# Patient Record
Sex: Male | Born: 1940 | Race: White | Hispanic: No | Marital: Married | Smoking: Former smoker
Health system: Southern US, Community
[De-identification: ages and names within clinical notes are randomized; demographics above are authoritative.]

## PROBLEM LIST (undated history)

## (undated) DIAGNOSIS — I10 Essential (primary) hypertension: Secondary | ICD-10-CM

## (undated) DIAGNOSIS — I639 Cerebral infarction, unspecified: Secondary | ICD-10-CM

## (undated) DIAGNOSIS — E785 Hyperlipidemia, unspecified: Secondary | ICD-10-CM

---

## 2002-06-09 ENCOUNTER — Ambulatory Visit (HOSPITAL_COMMUNITY): Admission: RE | Admit: 2002-06-09 | Discharge: 2002-06-09 | Payer: Self-pay | Admitting: Family Medicine

## 2002-06-09 ENCOUNTER — Encounter: Payer: Self-pay | Admitting: Family Medicine

## 2004-10-01 HISTORY — PX: MULTIPLE TOOTH EXTRACTIONS: SHX2053

## 2012-07-04 ENCOUNTER — Emergency Department (HOSPITAL_COMMUNITY): Payer: Medicare Other

## 2012-07-04 ENCOUNTER — Observation Stay (HOSPITAL_COMMUNITY)
Admission: EM | Admit: 2012-07-04 | Discharge: 2012-07-05 | Disposition: A | Discharge status: Home / Self Care | Payer: Medicare Other | Attending: Internal Medicine | Admitting: Internal Medicine

## 2012-07-04 ENCOUNTER — Encounter (HOSPITAL_COMMUNITY): Payer: Self-pay

## 2012-07-04 DIAGNOSIS — I10 Essential (primary) hypertension: Secondary | ICD-10-CM

## 2012-07-04 DIAGNOSIS — N390 Urinary tract infection, site not specified: Secondary | ICD-10-CM

## 2012-07-04 DIAGNOSIS — G459 Transient cerebral ischemic attack, unspecified: Secondary | ICD-10-CM

## 2012-07-04 DIAGNOSIS — I379 Nonrheumatic pulmonary valve disorder, unspecified: Secondary | ICD-10-CM | POA: Insufficient documentation

## 2012-07-04 DIAGNOSIS — Z8673 Personal history of transient ischemic attack (TIA), and cerebral infarction without residual deficits: Secondary | ICD-10-CM | POA: Diagnosis present

## 2012-07-04 DIAGNOSIS — I059 Rheumatic mitral valve disease, unspecified: Secondary | ICD-10-CM

## 2012-07-04 DIAGNOSIS — Z79899 Other long term (current) drug therapy: Secondary | ICD-10-CM | POA: Insufficient documentation

## 2012-07-04 LAB — URINALYSIS, ROUTINE W REFLEX MICROSCOPIC
Glucose, UA: NEGATIVE mg/dL
Hgb urine dipstick: NEGATIVE
Ketones, ur: NEGATIVE mg/dL
pH: 7.5 (ref 5.0–8.0)

## 2012-07-04 LAB — CBC WITH DIFFERENTIAL/PLATELET
Eosinophils Relative: 1 % (ref 0–5)
HCT: 40.9 % (ref 39.0–52.0)
Hemoglobin: 13.5 g/dL (ref 13.0–17.0)
Lymphocytes Relative: 15 % (ref 12–46)
Lymphs Abs: 1.4 10*3/uL (ref 0.7–4.0)
MCV: 82.1 fL (ref 78.0–100.0)
Monocytes Absolute: 1 10*3/uL (ref 0.1–1.0)
Monocytes Relative: 11 % (ref 3–12)
Platelets: 367 10*3/uL (ref 150–400)
RBC: 4.98 MIL/uL (ref 4.22–5.81)
WBC: 9 10*3/uL (ref 4.0–10.5)

## 2012-07-04 LAB — RAPID URINE DRUG SCREEN, HOSP PERFORMED
Amphetamines: NOT DETECTED
Benzodiazepines: NOT DETECTED
Opiates: NOT DETECTED

## 2012-07-04 LAB — URINE MICROSCOPIC-ADD ON

## 2012-07-04 LAB — BASIC METABOLIC PANEL
BUN: 22 mg/dL (ref 6–23)
Creatinine, Ser: 0.84 mg/dL (ref 0.50–1.35)
GFR calc Af Amer: >90 mL/min (ref >90)
GFR calc non Af Amer: 86 mL/min — ABNORMAL LOW (ref >90)

## 2012-07-04 LAB — HEMOGLOBIN A1C
Hgb A1c MFr Bld: 5.9 % — ABNORMAL HIGH (ref <5.7)
Mean Plasma Glucose: 123 mg/dL — ABNORMAL HIGH (ref <117)

## 2012-07-04 LAB — GLUCOSE, CAPILLARY
Glucose-Capillary: 107 mg/dL — ABNORMAL HIGH (ref 70–99)
Glucose-Capillary: 98 mg/dL (ref 70–99)

## 2012-07-04 MED ORDER — SODIUM CHLORIDE 0.9 % IJ SOLN
3.0000 mL | Freq: Two times a day (BID) | INTRAMUSCULAR | Status: DC
  Administered 2012-07-04: 3 mL via INTRAVENOUS

## 2012-07-04 MED ORDER — DEXTROSE 5 % IV SOLN
1.0000 g | Freq: Every day | INTRAVENOUS | Status: DC
  Administered 2012-07-04: 1 g via INTRAVENOUS
  Filled 2012-07-04: qty 10

## 2012-07-04 MED ORDER — INFLUENZA VIRUS VACC SPLIT PF IM SUSP: 0.5000 mL | INTRAMUSCULAR | Status: DC | PRN

## 2012-07-04 NOTE — ED Notes (Signed)
Pt reports frontal headache on and off started today with same occurrence 2 days ago. Wife brought pt to Hospital because pt's words in his conversation didn't make sense this morning. BP 201/81. No not take any HTN med

## 2012-07-04 NOTE — ED Notes (Signed)
Off floor for testing 

## 2012-07-04 NOTE — Progress Notes (Addendum)
VASCULAR LAB PRELIMINARY FINDINGS  CAROTID DOPPLER has been completed.   No significant ICA stenosis bilaterally.  Antegrade vertebral flow bilaterally.     Farrel Demark, RDMS, RVT 07/04/2012 4:17 PM

## 2012-07-04 NOTE — ED Notes (Signed)
Echo at bedside

## 2012-07-04 NOTE — H&P (Signed)
[facility] History and Physical  Shane Molina AVW:[id number] DOB: 1941/03/10 DOA: 07/04/2012  Referring physician: ER physician PCP: No primary provider on file.   Chief Complaint: slurred speach  HPI:  71 year old male with no significant past medical history who presented to ED with complaints of garbled speech for past 2 days and some associated lower extremity weakness. There was no dizziness or lightheadedness, no loss of consciousness. No complaints of chest pain, no shortness of breath, no palpitations. No sweating and no fever or chills.  Assessment and Plan:  Principal Problem:  *TIA (transient ischemic attack) - will follow up on carotid doppler and ECHO - CT head negative  Active Problems:  Hypertension - BP is WNL now - tele monitoring and VS per unit protocol  Manson Passey [facility] [phone]   Review of Systems:  Constitutional: Negative for fever, chills and malaise/fatigue. Negative for diaphoresis.  HENT: Negative for hearing loss, ear pain, nosebleeds, congestion, sore throat, neck pain, tinnitus and ear discharge.   Eyes: Negative for blurred vision, double vision, photophobia, pain, discharge and redness.  Respiratory: Negative for cough, hemoptysis, sputum production, shortness of breath, wheezing and stridor.   Cardiovascular: Negative for chest pain, palpitations, orthopnea, claudication and leg swelling.  Gastrointestinal: Negative for nausea, vomiting and abdominal pain. Negative for heartburn, constipation, blood in stool and melena.  Genitourinary: Negative for dysuria, urgency, frequency, hematuria and flank pain.  Musculoskeletal: Negative for myalgias, back pain, joint pain and falls.  Skin: Negative for itching and rash.  Neurological: Negative for dizziness and positive for transient weakness. Negative for tingling, tremors, sensory change, positive for slurred speach Endo/Heme/Allergies: Negative for environmental allergies and polydipsia.  Does not bruise/bleed easily.  Psychiatric/Behavioral: Negative for suicidal ideas. The patient is not nervous/anxious.      History reviewed. No pertinent past medical history. History reviewed. No pertinent past surgical history. Social History:  reports that he has quit smoking. His smoking use included Cigarettes. He has never used smokeless tobacco. He reports that he drinks alcohol. He reports that he does not use illicit drugs.  Allergies  Allergen Reactions  . Sulfa Antibiotics Rash    Family History: HTN in mother  Prior to Admission medications   Not on File   Physical Exam: Filed Vitals:   07/04/12 0905 07/04/12 1120 07/04/12 1130  BP: 201/81 183/76 176/70  Pulse: 73 83 73  Temp: 97.7 F (36.5 C)    TempSrc: Oral    Resp: 18 17 19   SpO2: 100% 96% 97%    Physical Exam  Constitutional: Appears well-developed and well-nourished. No distress.  HENT: Normocephalic. External right and left ear normal. Oropharynx is clear and moist.  Eyes: Conjunctivae and EOM are normal. PERRLA, no scleral icterus.  Neck: Normal ROM. Neck supple. No JVD. No tracheal deviation. No thyromegaly.  CVS: RRR, S1/S2 +, no murmurs, no gallops, no carotid bruit.  Pulmonary: Effort and breath sounds normal, no stridor, rhonchi, wheezes, rales.  Abdominal: Soft. BS +,  no distension, tenderness, rebound or guarding.  Musculoskeletal: Normal range of motion. No edema and no tenderness.  Lymphadenopathy: No lymphadenopathy noted, cervical, inguinal. Neuro: Alert. Normal reflexes, muscle tone coordination. No cranial nerve deficit. Skin: Skin is warm and dry. No rash noted. Not diaphoretic. No erythema. No pallor.  Psychiatric: Normal mood and affect. Behavior, judgment, thought content normal.   Labs on Admission:  Basic Metabolic Panel:  Lab 07/04/12 3086  NA 141  K 3.9  CL 104  CO2 26  GLUCOSE 108*  BUN 22  CREATININE 0.84  CALCIUM 9.7  MG --  PHOS --   Liver Function Tests: No  results found for this basename: AST:5,ALT:5,ALKPHOS:5,BILITOT:5,PROT:5,ALBUMIN:5 in the last 168 hours No results found for this basename: LIPASE:5,AMYLASE:5 in the last 168 hours No results found for this basename: AMMONIA:5 in the last 168 hours CBC:  Lab 07/04/12 1005  WBC 9.0  NEUTROABS 6.6  HGB 13.5  HCT 40.9  MCV 82.1  PLT 367   Cardiac Enzymes:  Lab 07/04/12 1005  CKTOTAL --  CKMB --  CKMBINDEX --  TROPONINI <0.30   BNP: No components found with this basename: POCBNP:5 CBG: No results found for this basename: GLUCAP:5 in the last 168 hours  Radiological Exams on Admission: Dg Chest 2 View  07/04/2012  *RADIOLOGY REPORT*  Clinical Data: Aphasia.  CHEST - 2 VIEW  Comparison: None.  Findings: Two views of the chest were obtained.  There are coarse lung markings suggesting chronic changes.  There is irregularity of the right eighth and ninth ribs probably representing old fractures.  Normal appearance of the heart and mediastinum.  There appears to be mild hyperinflation.  No evidence for airspace disease.  Probable scarring at lung apices, right side greater than left.  IMPRESSION: Mild hyperinflation and suspect chronic lung changes.  No evidence for airspace disease.  Suspect old right rib fractures.   Original Report Authenticated By: Richarda Overlie, M.D.    Ct Head Wo Contrast  07/04/2012  *RADIOLOGY REPORT*  Clinical Data: Headache and aphasia.  CT HEAD WITHOUT CONTRAST  Technique:  Contiguous axial images were obtained from the base of the skull through the vertex without contrast.  Comparison: None.  Findings: There is a focal soft tissue nodule or swelling that measures up to 1.4 cm along the right upper scalp on sequence two, image 26.  There is no acute hemorrhage, mass lesion, midline shift, hydrocephalus or large area of infarct.  Mild cerebral atrophy that appears appropriate for age.  There is mild mucosal disease in the sphenoid sinuses.  No acute bony abnormality.   IMPRESSION: No acute intracranial abnormality.  Focal soft tissue lesion or swelling along the right upper scalp. Recommend clinical correlation in this area.   Original Report Authenticated By: Richarda Overlie, M.D.     EKG: Normal sinus rhythm, no ST/T wave changes  Code Status: Full Family Communication: Pt at bedside Disposition Plan: Admit for further evaluation  Manson Passey, MD  [facility] Pager [phone]  If 7PM-7AM, please contact night-coverage [email or web address] Password [email or web address] 07/04/2012, 1:31 PM

## 2012-07-04 NOTE — ED Notes (Signed)
Per MD, may transport with monitor

## 2012-07-04 NOTE — Progress Notes (Signed)
   CARE MANAGEMENT NOTE 07/04/2012  Patient:  Shane Molina, Shane Molina   Account Number:  [id number]  Date Initiated:  07/04/2012  Documentation initiated by:  Jiles Crocker  Subjective/Objective Assessment:   ADMITTED WITH SLURRED SPEECH/ TIA WORKUP     Action/Plan:   LIVES AT HOME WITH SPOUSE; POSSIBLE NEED CIR VS HHC AT DISCHARGE; CM WILL CONTINUE TO FOLLOW FOR DCP   Anticipated DC Date:  07/11/2012   Anticipated DC Plan:  HOME W HOME HEALTH SERVICES      DC Planning Services  CM consult               Status of service:  In process, will continue to follow Medicare Important Message given?  NA - LOS <3 / Initial given by admissions (If response is "NO", the following Medicare IM given date fields will be blank)  Per UR Regulation:  Reviewed for med. necessity/level of care/duration of stay  Comments:  07/04/2012- B Guadlupe Spanish RN, BSN, MHA

## 2012-07-04 NOTE — Progress Notes (Signed)
[facility] ED CM noted medicare pt without pcp Spoke with him and male at bedside who states pt previously has seen Dr Modena Jansky, [facility] physicians but "doubts if pt will be able to see him again" Confirms living near [place] code [address]

## 2012-07-04 NOTE — ED Provider Notes (Signed)
History     CSN: [id number]  Arrival date & time 07/04/12  0865   First MD Initiated Contact with Patient 07/04/12 [phone]      Chief Complaint  Patient presents with  . Hypertension    (Consider location/radiation/quality/duration/timing/severity/associated sxs/prior treatment) Patient is a 71 y.o. male presenting with hypertension. The history is provided by the patient.  Hypertension Associated symptoms include headaches. Pertinent negatives include no chest pain, no abdominal pain and no shortness of breath.   patient reportedly has had a headache. It is dull and frontal. He states it is similar episode 2 days ago. Family member brought him in because patient was having difficulty speaking earlier today. Reportedly he was not making sense. She states that some words or words and some words are just gibberish. Blood pressure was found to be elevated. Is not on any blood pressure medications. No numbness or weakness. Patient states he feels better now. He did not want to come in to the ER. A chest pain or trouble breathing. He is a former smoker  History reviewed. No pertinent past medical history.  History reviewed. No pertinent past surgical history.  History reviewed. No pertinent family history.  History  Substance Use Topics  . Smoking status: Former Smoker    Types: Cigarettes  . Smokeless tobacco: Never Used  . Alcohol Use: Yes     occasionally      Review of Systems  Constitutional: Negative for activity change and appetite change.  HENT: Negative for neck stiffness.   Eyes: Negative for pain.  Respiratory: Negative for chest tightness and shortness of breath.   Cardiovascular: Negative for chest pain and leg swelling.  Gastrointestinal: Negative for nausea, vomiting, abdominal pain and diarrhea.  Genitourinary: Negative for flank pain.  Musculoskeletal: Negative for back pain.  Skin: Negative for rash.  Neurological: Positive for speech difficulty and headaches.  Negative for weakness and numbness.  Psychiatric/Behavioral: Negative for behavioral problems.    Allergies  Sulfa antibiotics  Home Medications  No current outpatient prescriptions on file.  BP 176/70  Pulse 73  Temp 97.7 F (36.5 C) (Oral)  Resp 19  SpO2 97%  Physical Exam  Nursing note and vitals reviewed. Constitutional: He is oriented to person, place, and time. He appears well-developed and well-nourished.  HENT:  Head: Normocephalic and atraumatic.  Eyes: EOM are normal. Pupils are equal, round, and reactive to light.  Neck: Normal range of motion. Neck supple.  Cardiovascular: Normal rate, regular rhythm and normal heart sounds.   No murmur heard. Pulmonary/Chest: Effort normal and breath sounds normal.  Abdominal: Soft. Bowel sounds are normal. He exhibits no distension and no mass. There is no tenderness. There is no rebound and no guarding.  Musculoskeletal: Normal range of motion. He exhibits no edema.  Neurological: He is alert and oriented to person, place, and time. No cranial nerve deficit.       Patient is awake and appropriate and able to speak normally at this time. Grip strength equal bilaterally.  Skin: Skin is warm and dry.  Psychiatric: He has a normal mood and affect.    ED Course  Procedures (including critical care time)  Labs Reviewed  URINALYSIS, ROUTINE W REFLEX MICROSCOPIC - Abnormal; Notable for the following:    APPearance TURBID (*)     Protein, ur 30 (*)     Nitrite POSITIVE (*)     Leukocytes, UA LARGE (*)     All other components within normal limits  BASIC METABOLIC  PANEL - Abnormal; Notable for the following:    Glucose, Bld 108 (*)     GFR calc non Af Amer 86 (*)     All other components within normal limits  URINE MICROSCOPIC-ADD ON - Abnormal; Notable for the following:    Bacteria, UA MANY (*)     Casts GRANULAR CAST (*)     Crystals TRIPLE PHOSPHATE CRYSTALS (*)     All other components within normal limits  CBC WITH  DIFFERENTIAL  TROPONIN I  HEMOGLOBIN A1C  URINE RAPID DRUG SCREEN (HOSP PERFORMED)   Dg Chest 2 View  07/04/2012  *RADIOLOGY REPORT*  Clinical Data: Aphasia.  CHEST - 2 VIEW  Comparison: None.  Findings: Two views of the chest were obtained.  There are coarse lung markings suggesting chronic changes.  There is irregularity of the right eighth and ninth ribs probably representing old fractures.  Normal appearance of the heart and mediastinum.  There appears to be mild hyperinflation.  No evidence for airspace disease.  Probable scarring at lung apices, right side greater than left.  IMPRESSION: Mild hyperinflation and suspect chronic lung changes.  No evidence for airspace disease.  Suspect old right rib fractures.   Original Report Authenticated By: Richarda Overlie, M.D.    Ct Head Wo Contrast  07/04/2012  *RADIOLOGY REPORT*  Clinical Data: Headache and aphasia.  CT HEAD WITHOUT CONTRAST  Technique:  Contiguous axial images were obtained from the base of the skull through the vertex without contrast.  Comparison: None.  Findings: There is a focal soft tissue nodule or swelling that measures up to 1.4 cm along the right upper scalp on sequence two, image 26.  There is no acute hemorrhage, mass lesion, midline shift, hydrocephalus or large area of infarct.  Mild cerebral atrophy that appears appropriate for age.  There is mild mucosal disease in the sphenoid sinuses.  No acute bony abnormality.  IMPRESSION: No acute intracranial abnormality.  Focal soft tissue lesion or swelling along the right upper scalp. Recommend clinical correlation in this area.   Original Report Authenticated By: Richarda Overlie, M.D.      1. TIA (transient ischemic attack)   2. Hypertension   3. UTI (urinary tract infection)     Date: 07/04/2012  Rate: 77  Rhythm: normal sinus rhythm and premature atrial contractions (PAC)  QRS Axis: normal  Intervals: normal  ST/T Wave abnormalities: normal  Conduction Disutrbances:none  Narrative  Interpretation:   Old EKG Reviewed: changes noted     MDM  Patient presented after episode of difficulty speaking. He reportedly had trouble finding the words. Head CT is reassuring. Lab work is reassuring except for a urinary tract infection. Patient was initially moderately hypertensive here. At the aphasia TIA needs to be ruled out. UTI was a possibility of confusion, but I'm not ready to say it was definitely the cause. Patient be admitted to medicine.      A once a day  Juliet Rude. Rubin Payor, MD 07/04/12 1439

## 2012-07-04 NOTE — ED Notes (Signed)
Report given to Grace,RN.

## 2012-07-04 NOTE — Progress Notes (Signed)
  Echocardiogram 2D Echocardiogram has been performed.  Shane Molina 07/04/2012, 3:21 PM

## 2012-07-04 NOTE — Progress Notes (Signed)
[facility] ED CM provided pt and male at bedside a list of medicare providers and united health [employer] medicare providers for zip code [address] plus contact information for [employer] medicare advantage plans

## 2012-07-05 DIAGNOSIS — N390 Urinary tract infection, site not specified: Secondary | ICD-10-CM

## 2012-07-05 LAB — LIPID PANEL
Cholesterol: 234 mg/dL — ABNORMAL HIGH (ref 0–200)
HDL: 72 mg/dL (ref >39)
Total CHOL/HDL Ratio: 3.3 RATIO
VLDL: 11 mg/dL (ref 0–40)

## 2012-07-05 LAB — GLUCOSE, CAPILLARY: Glucose-Capillary: 89 mg/dL (ref 70–99)

## 2012-07-05 MED ORDER — LISINOPRIL 10 MG PO TABS: 10.0000 mg | ORAL_TABLET | Freq: Every day | ORAL | Status: DC

## 2012-07-05 MED ORDER — AMLODIPINE BESYLATE 5 MG PO TABS: 5.0000 mg | ORAL_TABLET | Freq: Every day | ORAL | Status: DC

## 2012-07-05 MED ORDER — AMLODIPINE BESYLATE 5 MG PO TABS
5.0000 mg | ORAL_TABLET | Freq: Every day | ORAL | Status: DC
  Filled 2012-07-05: qty 1

## 2012-07-05 MED ORDER — DOXYCYCLINE HYCLATE 100 MG PO TABS: 100.0000 mg | ORAL_TABLET | Freq: Two times a day (BID) | ORAL | Status: DC

## 2012-07-05 NOTE — Discharge Summary (Signed)
Physician Discharge Summary  MATHIS CASHMAN BJY:[id number] DOB: 1940/12/30 DOA: 07/04/2012  PCP: No primary provider on file.  Admit date: 07/04/2012 Discharge date: 07/05/2012  Recommendations for Outpatient Follow-up:  1. Pt will need to follow up with PCP in 2-3 weeks post discharge 2. Please obtain BMP to evaluate electrolytes and kidney function 3. Please also check CBC to evaluate Hg and Hct levels 4. Please note that pt was given prescriptions for Norvasc and Lisinopril 5. Medical non compliance was discussed in detail  Discharge Diagnoses: slurred speech secondary to TIA Principal Problem:  *TIA (transient ischemic attack) Active Problems:  Hypertension    Discharge Condition: Stable  Diet recommendation: Heart healthy diet discussed in details   History of present illness:  71 year old male with no significant past medical history who presented to ED with complaints of garbled speech for past 2 days and some associated lower extremity weakness. There was no dizziness or lightheadedness, no loss of consciousness. No complaints of chest pain, no shortness of breath, no palpitations. No sweating and no fever or chills.   Hospital Course:  Principal Problem:  *TIA (transient ischemic attack) - now symptoms completely resolved - no need for MRI due to symptoms resolution - risk factors control discussed in detail with pt and family - pt was also advised to be compliant with BP medications and prescriptions were given - carotid doppler preliminary report with no significant stenosis  Active Problems:  Hypertension - accelerated and uncontrolled secondary to medical noncompliance  - pt was given prescription for Amlodipine and Lisinopril and was advised to follow up with PCP in 1-2 weeks to have blood work done: [employer]  Procedures/Studies: Dg Chest 2 View 07/04/2012  *RADIOLOGY REPORT*  Clinical Data: Aphasia.  CHEST - 2 VIEW  Comparison: None.  Findings: Two views of the  chest were obtained.  There are coarse lung markings suggesting chronic changes.  There is irregularity of the right eighth and ninth ribs probably representing old fractures.  Normal appearance of the heart and mediastinum.  There appears to be mild hyperinflation.  No evidence for airspace disease.  Probable scarring at lung apices, right side greater than left.  IMPRESSION: Mild hyperinflation and suspect chronic lung changes.  No evidence for airspace disease.  Suspect old right rib fractures.   Original Report Authenticated By: Richarda Overlie, M.D.    Ct Head Wo Contrast 07/04/2012  *RADIOLOGY REPORT*  Clinical Data: Headache and aphasia.  CT HEAD WITHOUT CONTRAST  Technique:  Contiguous axial images were obtained from the base of the skull through the vertex without contrast.  Comparison: None.  Findings: There is a focal soft tissue nodule or swelling that measures up to 1.4 cm along the right upper scalp on sequence two, image 26.  There is no acute hemorrhage, mass lesion, midline shift, hydrocephalus or large area of infarct.  Mild cerebral atrophy that appears appropriate for age.  There is mild mucosal disease in the sphenoid sinuses.  No acute bony abnormality.  IMPRESSION: No acute intracranial abnormality.  Focal soft tissue lesion or swelling along the right upper scalp. Recommend clinical correlation in this area.   Original Report Authenticated By: Richarda Overlie, M.D.     Consultations:  None  Antibiotics:  None  Discharge Exam: Filed Vitals:   07/05/12 0500  BP: 171/72  Pulse: 62  Temp: 98 F (36.7 C)  Resp: 20   Filed Vitals:   07/04/12 1830 07/04/12 2110 07/05/12 0100 07/05/12 0500  BP:  178/85 176/82 171/72  Pulse:  65 68 62  Temp:  98.8 F (37.1 C) 98.4 F (36.9 C) 98 F (36.7 C)  TempSrc:  Oral Oral Oral  Resp:  20 20 20   Height: 5\' 8"  (1.727 m)     Weight: 65.3 kg (143 lb 15.4 oz)     SpO2:  98% 98% 99%    General: Pt is alert, follows commands appropriately, not  in acute distress Cardiovascular: Regular rate and rhythm, S1/S2 +, no murmurs, no rubs, no gallops Respiratory: Clear to auscultation bilaterally, no wheezing, no crackles, no rhonchi Abdominal: Soft, non tender, non distended, bowel sounds +, no guarding Extremities: no edema, no cyanosis, pulses palpable bilaterally DP and PT Neuro: Grossly nonfocal  Discharge Instructions  Discharge Orders    Future Orders Please Complete By Expires   Diet - low sodium heart healthy      Increase activity slowly          Medication List     As of 07/05/2012  8:25 AM    TAKE these medications         amLODipine 5 MG tablet   Commonly known as: NORVASC   Take 1 tablet (5 mg total) by mouth daily.      doxycycline 100 MG tablet   Commonly known as: VIBRA-TABS   Take 1 tablet (100 mg total) by mouth 2 (two) times daily.      lisinopril 10 MG tablet   Commonly known as: PRINIVIL,ZESTRIL   Take 1 tablet (10 mg total) by mouth daily.           Follow-up Information    Follow up with Dr. Izola Price . In 1 week.   Contact information:   Call [phone] with any questions          The results of significant diagnostics from this hospitalization (including imaging, microbiology, ancillary and laboratory) are listed below for reference.     Microbiology: No results found for this or any previous visit (from the past 240 hour(s)).   Labs: Basic Metabolic Panel:  Lab 07/04/12 0981  NA 141  K 3.9  CL 104  CO2 26  GLUCOSE 108*  BUN 22  CREATININE 0.84  CALCIUM 9.7  MG --  PHOS --   CBC:  Lab 07/04/12 1005  WBC 9.0  NEUTROABS 6.6  HGB 13.5  HCT 40.9  MCV 82.1  PLT 367   Cardiac Enzymes:  Lab 07/04/12 1005  CKTOTAL --  CKMB --  CKMBINDEX --  TROPONINI <0.30   CBG:  Lab 07/05/12 0754 07/04/12 2218 07/04/12 1641  GLUCAP 89 98 107*    SIGNED: Time coordinating discharge: Over 30 minutes  Debbora Presto, MD  [facility] 07/05/2012, 8:25 AM Pager  [phone]  If 7PM-7AM, please contact night-coverage [email or web address] Password [email or web address]

## 2012-07-07 LAB — URINE CULTURE

## 2013-12-24 ENCOUNTER — Encounter (HOSPITAL_COMMUNITY): Payer: Self-pay | Admitting: Emergency Medicine

## 2013-12-24 ENCOUNTER — Emergency Department (HOSPITAL_COMMUNITY): Payer: Medicare Other

## 2013-12-24 ENCOUNTER — Observation Stay (HOSPITAL_COMMUNITY)
Admission: EM | Admit: 2013-12-24 | Discharge: 2013-12-25 | Disposition: A | Discharge status: Home / Self Care | Payer: Medicare Other | Attending: Internal Medicine | Admitting: Internal Medicine

## 2013-12-24 DIAGNOSIS — N390 Urinary tract infection, site not specified: Principal | ICD-10-CM | POA: Insufficient documentation

## 2013-12-24 DIAGNOSIS — Z87891 Personal history of nicotine dependence: Secondary | ICD-10-CM | POA: Insufficient documentation

## 2013-12-24 DIAGNOSIS — Z8673 Personal history of transient ischemic attack (TIA), and cerebral infarction without residual deficits: Secondary | ICD-10-CM | POA: Diagnosis present

## 2013-12-24 DIAGNOSIS — I69920 Aphasia following unspecified cerebrovascular disease: Secondary | ICD-10-CM | POA: Insufficient documentation

## 2013-12-24 DIAGNOSIS — R4701 Aphasia: Secondary | ICD-10-CM | POA: Diagnosis present

## 2013-12-24 DIAGNOSIS — G459 Transient cerebral ischemic attack, unspecified: Secondary | ICD-10-CM

## 2013-12-24 DIAGNOSIS — I10 Essential (primary) hypertension: Secondary | ICD-10-CM

## 2013-12-24 DIAGNOSIS — I6350 Cerebral infarction due to unspecified occlusion or stenosis of unspecified cerebral artery: Secondary | ICD-10-CM | POA: Insufficient documentation

## 2013-12-24 HISTORY — DX: Cerebral infarction, unspecified: I63.9

## 2013-12-24 LAB — URINALYSIS, ROUTINE W REFLEX MICROSCOPIC
Bilirubin Urine: NEGATIVE
Glucose, UA: NEGATIVE mg/dL
Hgb urine dipstick: NEGATIVE
Ketones, ur: NEGATIVE mg/dL
NITRITE: POSITIVE — AB
PROTEIN: NEGATIVE mg/dL
Specific Gravity, Urine: 1.014 (ref 1.005–1.030)
Urobilinogen, UA: 0.2 mg/dL (ref 0.0–1.0)
pH: 7.5 (ref 5.0–8.0)

## 2013-12-24 LAB — COMPREHENSIVE METABOLIC PANEL
ALT: 14 U/L (ref 0–53)
AST: 21 U/L (ref 0–37)
Albumin: 4.2 g/dL (ref 3.5–5.2)
Alkaline Phosphatase: 75 U/L (ref 39–117)
BILIRUBIN TOTAL: 0.5 mg/dL (ref 0.3–1.2)
BUN: 15 mg/dL (ref 6–23)
CHLORIDE: 102 meq/L (ref 96–112)
CO2: 26 mEq/L (ref 19–32)
Calcium: 10.1 mg/dL (ref 8.4–10.5)
Creatinine, Ser: 0.99 mg/dL (ref 0.50–1.35)
GFR calc Af Amer: >90 mL/min (ref >90)
GFR calc non Af Amer: 80 mL/min — ABNORMAL LOW (ref >90)
GLUCOSE: 96 mg/dL (ref 70–99)
Potassium: 4.1 mEq/L (ref 3.7–5.3)
Sodium: 141 mEq/L (ref 137–147)
Total Protein: 7.9 g/dL (ref 6.0–8.3)

## 2013-12-24 LAB — HEMOGLOBIN A1C
Hgb A1c MFr Bld: 5.8 % — ABNORMAL HIGH (ref <5.7)
Mean Plasma Glucose: 120 mg/dL — ABNORMAL HIGH (ref <117)

## 2013-12-24 LAB — RAPID URINE DRUG SCREEN, HOSP PERFORMED
Amphetamines: NOT DETECTED
Barbiturates: NOT DETECTED
Benzodiazepines: NOT DETECTED
Cocaine: NOT DETECTED
OPIATES: NOT DETECTED
Tetrahydrocannabinol: NOT DETECTED

## 2013-12-24 LAB — CBC
HEMATOCRIT: 37.3 % — AB (ref 39.0–52.0)
HEMOGLOBIN: 12.5 g/dL — AB (ref 13.0–17.0)
MCH: 27.5 pg (ref 26.0–34.0)
MCHC: 33.5 g/dL (ref 30.0–36.0)
MCV: 82 fL (ref 78.0–100.0)
Platelets: 339 10*3/uL (ref 150–400)
RBC: 4.55 MIL/uL (ref 4.22–5.81)
RDW: 14.1 % (ref 11.5–15.5)
WBC: 8 10*3/uL (ref 4.0–10.5)

## 2013-12-24 LAB — CBG MONITORING, ED: GLUCOSE-CAPILLARY: 89 mg/dL (ref 70–99)

## 2013-12-24 LAB — URINE MICROSCOPIC-ADD ON

## 2013-12-24 LAB — TROPONIN I

## 2013-12-24 LAB — APTT: aPTT: 37 seconds (ref 24–37)

## 2013-12-24 LAB — PROTIME-INR
INR: 0.98 (ref 0.00–1.49)
Prothrombin Time: 12.8 seconds (ref 11.6–15.2)

## 2013-12-24 MED ORDER — DEXTROSE 5 % IV SOLN
1.0000 g | INTRAVENOUS | Status: DC
  Administered 2013-12-25: 1 g via INTRAVENOUS
  Filled 2013-12-24 (×2): qty 10

## 2013-12-24 MED ORDER — VALSARTAN-HYDROCHLOROTHIAZIDE 80-12.5 MG PO TABS: 1.0000 | ORAL_TABLET | ORAL | Status: DC

## 2013-12-24 MED ORDER — ASPIRIN 325 MG PO TABS
325.0000 mg | ORAL_TABLET | Freq: Every day | ORAL | Status: DC
  Administered 2013-12-25: 325 mg via ORAL
  Filled 2013-12-24: qty 1

## 2013-12-24 MED ORDER — HYDROCHLOROTHIAZIDE 12.5 MG PO CAPS
12.5000 mg | ORAL_CAPSULE | ORAL | Status: AC
  Administered 2013-12-24: 12.5 mg via ORAL
  Filled 2013-12-24: qty 1

## 2013-12-24 MED ORDER — IRBESARTAN 75 MG PO TABS
75.0000 mg | ORAL_TABLET | ORAL | Status: AC
  Administered 2013-12-24: 75 mg via ORAL
  Filled 2013-12-24: qty 1

## 2013-12-24 MED ORDER — ENOXAPARIN SODIUM 40 MG/0.4ML ~~LOC~~ SOLN
40.0000 mg | Freq: Every day | SUBCUTANEOUS | Status: DC
  Administered 2013-12-25: 40 mg via SUBCUTANEOUS
  Filled 2013-12-24 (×2): qty 0.4

## 2013-12-24 MED ORDER — DEXTROSE 5 % IV SOLN
1.0000 g | Freq: Once | INTRAVENOUS | Status: AC
  Administered 2013-12-24: 1 g via INTRAVENOUS
  Filled 2013-12-24: qty 10

## 2013-12-24 NOTE — H&P (Signed)
[facility] History and Physical  Patient: Shane Molina  ZOX:[id number]  DOB: May 27, 1941  DOS: the patient was seen and examined on 12/24/2013 PCP: No primary provider on file.  Chief Complaint: Slurred speech  HPI: Shane Molina is a 73 y.o. male with Past medical history of TIA, hypertension. The patient presented with complaints of slurred speech that happened earlier at 6:30 in the morning. He woke up from sleep at which time he was having difficulty breathing at his word. As per the wife he was able to speak towards correctly but they were not in the proper sentence form.  He did not have any dizziness, lightheadedness, fever, chills, chest pain, palpitation, shortness of breath, blurred vision, vertigo, nausea, vomiting, abdominal pain, diarrhea, burning urination, other focal neurological deficit during or after this episode. This episode lasted for 20 minutes and resolved. The patient had similar episode a few months ago. But no recent episodes. He mentions since last one week he has not been taking his blood pressure medication as he has been out of them on has not refilled them back. Is also not taking aspirin on a regular daily basis.  The patient is coming from home. And at his baseline he Independent for most of his  ADL.  Review of Systems: as mentioned in the history of present illness.  A Comprehensive review of the other systems is negative.  Past Medical History  Diagnosis Date  . Stroke    Past Surgical History  Procedure Laterality Date  . No past surgeries     Social History:  reports that he quit smoking about 22 years ago. His smoking use included Cigarettes. He smoked 0.00 packs per day. He has never used smokeless tobacco. He reports that he drinks alcohol. He reports that he does not use illicit drugs.  Allergies  Allergen Reactions  . Sulfa Antibiotics Rash    History reviewed. No pertinent family history.  Prior to Admission medications    Medication Sig Start Date End Date Taking? Authorizing Provider  aspirin 81 MG chewable tablet Chew 81 mg by mouth at bedtime.   Yes Historical Provider, MD  valsartan-hydrochlorothiazide (DIOVAN-HCT) 80-12.5 MG per tablet Take 1 tablet by mouth at bedtime and may repeat dose one time if needed.   Yes Historical Provider, MD    Physical Exam: Filed Vitals:   12/24/13 1651 12/24/13 2026  BP: 180/93 151/70  Pulse: 90 61  Temp: 98.1 F (36.7 C) 98.1 F (36.7 C)  TempSrc: Oral Oral  Resp: 18 20  SpO2: 97% 96%    General: Alert, Awake and Oriented to Time, Place and Person. Appear in mild distress Eyes: PERRL, horizontal nystagmus on movement the right ENT: Oral Mucosa clear moist. Neck: no JVD Cardiovascular: S1 and S2 Present, faint aortic systolic Murmur, Peripheral Pulses Present Respiratory: Bilateral Air entry equal and Decreased, Clear to Auscultation,  No  Crackles,no  wheezes Abdomen: Bowel Sound Present, Soft and Non tender Skin: No  Rash Extremities: No  Pedal edema, no  calf tenderness Neurologic: Grossly Unremarkable. Mental status AAOx3, speech normal, attention normal, Cranial Nerves PERRL, EOM normal and present, facial sensation to light touch present: , Motor strength bilateral equal strength 5/5, Sensation present to light touch, reflexes present knee and biceps, babinski negative, Cerebellar test normal finger nose finger.   Labs on Admission:  CBC:  Recent Labs Lab 12/24/13 1745  WBC 8.0  HGB 12.5*  HCT 37.3*  MCV 82.0  PLT 339  CMP     Component Value Date/Time   NA 141 12/24/2013 1745   K 4.1 12/24/2013 1745   CL 102 12/24/2013 1745   CO2 26 12/24/2013 1745   GLUCOSE 96 12/24/2013 1745   BUN 15 12/24/2013 1745   CREATININE 0.99 12/24/2013 1745   CALCIUM 10.1 12/24/2013 1745   PROT 7.9 12/24/2013 1745   ALBUMIN 4.2 12/24/2013 1745   AST 21 12/24/2013 1745   ALT 14 12/24/2013 1745   ALKPHOS 75 12/24/2013 1745   BILITOT 0.5 12/24/2013 1745   GFRNONAA  80* 12/24/2013 1745   GFRAA >90 12/24/2013 1745    No results found for this basename: LIPASE, AMYLASE,  in the last 168 hours No results found for this basename: AMMONIA,  in the last 168 hours   Recent Labs Lab 12/24/13 1745  TROPONINI <0.30   BNP (last 3 results) No results found for this basename: PROBNP,  in the last 8760 hours  Radiological Exams on Admission: Ct Head Wo Contrast  12/24/2013   CLINICAL DATA:  Slurred speech, history of stroke 1 year ago.  EXAM: CT HEAD WITHOUT CONTRAST  TECHNIQUE: Contiguous axial images were obtained from the base of the skull through the vertex without intravenous contrast.  COMPARISON:  CT HEAD W/O CM dated 07/04/2012  FINDINGS: The ventricles and sulci are normal for age. No intraparenchymal hemorrhage, mass effect nor midline shift. Patchy supratentorial white matter hypodensities are less than expected for patient's age and though non-specific suggest sequelae of chronic small vessel ischemic disease. No acute large vascular territory infarcts.  No abnormal extra-axial fluid collections. Basal cisterns are patent. Moderate calcific atherosclerosis of the carotid siphons.  No skull fracture. Similar right frontal 13 mm scalp nodule could reflect sebaceous cyst. Visualized paranasal sinuses and mastoid aircells are well-aerated, improved. The included ocular globes and orbital contents are non-suspicious. Moderate temporomandibular osteoarthrosis.  IMPRESSION: No acute intracranial process. If clinical concern for acute ischemia, MRI of the brain with diffusion weighted sequences would be more sensitive.   Electronically Signed   By: Awilda Metro   On: 12/24/2013 18:10    EKG: Independently reviewed. normal EKG, normal sinus rhythm, nonspecific ST and T waves changes.  Assessment/Plan Principal Problem:   TIA (transient ischemic attack) Active Problems:   Hypertension   Expressive aphasia   1. TIA (transient ischemic attack) The patient is  presenting with complaints of aphasia. He was comprehending what she was telling him. At present he is at his baseline and his neuro examination does not show any acute finding other than horizontal nystagmus. CT of the head is negative initial lab work shows possibility of UTI. He would be admitted to the hospital and transferred to [facility] cone for further workup related to stroke, we will obtain MRI brain, echocardiogram and carotid duplex. At present I am increasing his aspirin dose from 81-225 mg since is not compliant with the medication on a regular basis. Further changes or neurologic consultation.  2.Hypertension Patient initially presented with elevated blood pressure At present his blood pressure is stable I will allow permissive hypertension overnight  3.UTI IV ceftriaxone urine culture to be followed   Consults:  neurology  DVT Prophylaxis: subcutaneous Heparin Nutrition:  n.p.o. pending stroke evaluation  Code Status:  full  Family Communication:  wife was present at bedside, opportunity was given to ask question and all questions were answered satisfactorily at the time of interview. Disposition: Admitted to observation in telemetry unit.  Author: Lynden Oxford, MD [facility] Pager: [phone] 12/24/2013, 8:55 PM    If 7PM-7AM, please contact night-coverage [email or web address] Password [email or web address]

## 2013-12-24 NOTE — Consult Note (Signed)
Neuro Consult  Referring MD: Allena KatzPatel    Chief Complaint: Difficulty with speech  HPI: Shane CoveyRaymond E Mazurowski is an 73 y.o. male who reports that he awakened normal today and initially was able to speak to his wife without any problems.  Not long afterward though when he attempted to speak although words came out they did not make sense.  Blood pressure was taken and was elevated. His symptoms lasted for about 30 minutes then resolved spontaneously.   The patient had a similar episode in October of 2013.  Has had about 2 episodes since that time that were short lived.  Takes his aspirin intermittently.  Has been out of his blood pressure medications recently as well.    Date last known well: Date: 05/26/2014 Time last known well: Time: 06:30 tPA Given: No: Resolution of symptoms  Past Medical History  Diagnosis Date  . Stroke     -  Hypretension  Past Surgical History  Procedure Laterality Date  . Multiple tooth extractions Bilateral 2006    Patient is unsure of year, states "maybe 10 years."    Family history: Father died of a brain tumor.  Mother died of a MI.  Has a brother that has had multiple MI's.   Social History:  reports that he quit smoking about 22 years ago. His smoking use included Cigarettes. He smoked 0.00 packs per day. He has never used smokeless tobacco. He reports that he drinks alcohol. He reports that he does not use illicit drugs.  Allergies:  Allergies  Allergen Reactions  . Sulfa Antibiotics Rash    Medications Prior to Admission  Medication Sig Dispense Refill  . aspirin 81 MG chewable tablet Chew 81 mg by mouth at bedtime.      . valsartan-hydrochlorothiazide (DIOVAN-HCT) 80-12.5 MG per tablet Take 1 tablet by mouth at bedtime and may repeat dose one time if needed.        ROS: History obtained from the patient  General ROS: negative for - chills, fatigue, fever, night sweats, weight gain or weight loss Psychological ROS: negative for - behavioral  disorder, hallucinations, memory difficulties, mood swings or suicidal ideation Ophthalmic ROS: negative for - blurry vision, double vision, eye pain or loss of vision ENT ROS: negative for - epistaxis, nasal discharge, oral lesions, sore throat, tinnitus or vertigo Allergy and Immunology ROS: negative for - hives or itchy/watery eyes Hematological and Lymphatic ROS: negative for - bleeding problems, bruising or swollen lymph nodes Endocrine ROS: negative for - galactorrhea, hair pattern changes, polydipsia/polyuria or temperature intolerance Respiratory ROS: negative for - cough, hemoptysis, shortness of breath or wheezing Cardiovascular ROS: negative for - chest pain, dyspnea on exertion, edema or irregular heartbeat Gastrointestinal ROS: negative for - abdominal pain, diarrhea, hematemesis, nausea/vomiting or stool incontinence Genito-Urinary ROS: negative for - dysuria, hematuria, incontinence or urinary frequency/urgency Musculoskeletal ROS: negative for - joint swelling or muscular weakness Neurological ROS: as noted in HPI Dermatological ROS: negative for rash and skin lesion changes  Physical Examination: Blood pressure 168/83, pulse 51, temperature 98.5 F (36.9 C), temperature source Oral, resp. rate 20, height 5\' 8"  (1.727 m), weight 68.584 kg (151 lb 3.2 oz), SpO2 99.00%.   Neurologic Examination: Mental Status: Alert, oriented, thought content appropriate.  Speech fluent without evidence of aphasia.  Able to follow 3 step commands without difficulty. Cranial Nerves: II: Discs flat bilaterally; Visual fields grossly normal, pupils equal, round, reactive to light and accommodation III,IV, VI: ptosis not present, extra-ocular motions intact bilaterally V,VII:  smile symmetric, facial light touch sensation normal bilaterally VIII: hearing normal bilaterally IX,X: gag reflex present XI: bilateral shoulder shrug XII: midline tongue extension Motor: Right : Upper extremity    5/5    Left:     Upper extremity   5/5  Lower extremity   5/5     Lower extremity   5/5 Tone and bulk:normal tone throughout; no atrophy noted Sensory: Pinprick and light touch intact throughout, bilaterally Deep Tendon Reflexes: 2+ and symmetric throughout Plantars: Right: downgoing   Left: downgoing Cerebellar: normal finger-to-nose and normal heel-to-shin test Gait: Unable to test CV: pulses palpable throughout     Laboratory Studies:   Basic Metabolic Panel:  Recent Labs Lab 12/24/13 1745  NA 141  K 4.1  CL 102  CO2 26  GLUCOSE 96  BUN 15  CREATININE 0.99  CALCIUM 10.1    Liver Function Tests:  Recent Labs Lab 12/24/13 1745  AST 21  ALT 14  ALKPHOS 75  BILITOT 0.5  PROT 7.9  ALBUMIN 4.2   No results found for this basename: LIPASE, AMYLASE,  in the last 168 hours No results found for this basename: AMMONIA,  in the last 168 hours  CBC:  Recent Labs Lab 12/24/13 1745  WBC 8.0  HGB 12.5*  HCT 37.3*  MCV 82.0  PLT 339    Cardiac Enzymes:  Recent Labs Lab 12/24/13 1745  TROPONINI <0.30    BNP: No components found with this basename: POCBNP,   CBG:  Recent Labs Lab 12/24/13 1710  GLUCAP 89    Microbiology: Results for orders placed during the hospital encounter of 07/04/12  URINE CULTURE     Status: None   Collection Time    07/04/12 10:25 AM      Result Value Ref Range Status   Specimen Description URINE, RANDOM   Final   Special Requests NONE   Final   Culture  Setup Time 07/05/2012 01:27   Final   Colony Count >=100,000 COLONIES/ML   Final   Culture ESCHERICHIA COLI   Final   Report Status 07/07/2012 FINAL   Final   Organism ID, Bacteria ESCHERICHIA COLI   Final    Coagulation Studies:  Recent Labs  12/24/13 1745  LABPROT 12.8  INR 0.98    Urinalysis:  Recent Labs Lab 12/24/13 1715  COLORURINE YELLOW  LABSPEC 1.014  PHURINE 7.5  GLUCOSEU NEGATIVE  HGBUR NEGATIVE  BILIRUBINUR NEGATIVE  KETONESUR NEGATIVE   PROTEINUR NEGATIVE  UROBILINOGEN 0.2  NITRITE POSITIVE*  LEUKOCYTESUR MODERATE*    Lipid Panel:     Component Value Date/Time   CHOL 234* 07/05/2012 0528   TRIG 55 07/05/2012 0528   HDL 72 07/05/2012 0528   CHOLHDL 3.3 07/05/2012 0528   VLDL 11 07/05/2012 0528   LDLCALC 151* 07/05/2012 0528    HgbA1C:  Lab Results  Component Value Date   HGBA1C 5.8* 12/24/2013    Urine Drug Screen:     Component Value Date/Time   LABOPIA NONE DETECTED 12/24/2013 1754   COCAINSCRNUR NONE DETECTED 12/24/2013 1754   LABBENZ NONE DETECTED 12/24/2013 1754   AMPHETMU NONE DETECTED 12/24/2013 1754   THCU NONE DETECTED 12/24/2013 1754   LABBARB NONE DETECTED 12/24/2013 1754    Alcohol Level: No results found for this basename: ETH,  in the last 168 hours  Other results: EKG:  sinus rhythm at 75 bpm.  Imaging: Ct Head Wo Contrast  12/24/2013   CLINICAL DATA:  Slurred speech, history of stroke 1  year ago.  EXAM: CT HEAD WITHOUT CONTRAST  TECHNIQUE: Contiguous axial images were obtained from the base of the skull through the vertex without intravenous contrast.  COMPARISON:  CT HEAD W/O CM dated 07/04/2012  FINDINGS: The ventricles and sulci are normal for age. No intraparenchymal hemorrhage, mass effect nor midline shift. Patchy supratentorial white matter hypodensities are less than expected for patient's age and though non-specific suggest sequelae of chronic small vessel ischemic disease. No acute large vascular territory infarcts.  No abnormal extra-axial fluid collections. Basal cisterns are patent. Moderate calcific atherosclerosis of the carotid siphons.  No skull fracture. Similar right frontal 13 mm scalp nodule could reflect sebaceous cyst. Visualized paranasal sinuses and mastoid aircells are well-aerated, improved. The included ocular globes and orbital contents are non-suspicious. Moderate temporomandibular osteoarthrosis.  IMPRESSION: No acute intracranial process. If clinical concern for acute  ischemia, MRI of the brain with diffusion weighted sequences would be more sensitive.   Electronically Signed   By: Awilda Metro   On: 12/24/2013 18:10    Assessment: 73 y.o. male presenting after an episode of difficulty with speech.  Symptoms have now resolved therefore patient is not a tPA candidate.  He has not been compliant with his medications and BP was elevated earlier today.  Head CT reviewed and shows no acute changes.  Symptoms likely represent a TIA but can not rule out a small acute infarct.  Further work up recommended.  A1c-5.8  Stroke Risk Factors - hypertension  Plan: 1. Fasting lipid panel 2. MRI, MRA  of the brain without contrast 3. Echocardiogram 4. Carotid dopplers 5. Prophylactic therapy-Antiplatelet med: Aspirin - dose 325mg  daily 7. Compliance stressed 8. Telemetry monitoring 9. Frequent neuro checks   Thana Farr, MD [facility] [phone] 12/24/2013, 11:37 PM

## 2013-12-24 NOTE — ED Provider Notes (Signed)
CSN: [id number]     Arrival date & time 12/24/13  1639 History   First MD Initiated Contact with Patient 12/24/13 1713     Chief Complaint  Patient presents with  . Altered Mental Status     (Consider location/radiation/quality/duration/timing/severity/associated sxs/prior Treatment) Patient is a 73 y.o. male presenting with altered mental status. The history is provided by the patient.  Altered Mental Status Presenting symptoms comment:  Expressive aphasia  Severity:  Mild Most recent episode:  Today Episode history:  Single Duration:  20 minutes Timing: once. Progression:  Resolved Chronicity: has had similar sx 2 years ago, was worked up for TIA at that time. Context comment:  While making breakfast Associated symptoms: no abdominal pain, no fever, no headaches, no nausea and no vomiting     Past Medical History  Diagnosis Date  . Stroke    History reviewed. No pertinent past surgical history. History reviewed. No pertinent family history. History  Substance Use Topics  . Smoking status: Former Smoker    Types: Cigarettes  . Smokeless tobacco: Never Used  . Alcohol Use: Yes     Comment: occasionally    Review of Systems  Constitutional: Negative for fever.  HENT: Negative for drooling and rhinorrhea.   Eyes: Negative for pain.  Respiratory: Negative for cough and shortness of breath.   Cardiovascular: Negative for chest pain and leg swelling.  Gastrointestinal: Negative for nausea, vomiting, abdominal pain and diarrhea.  Genitourinary: Negative for dysuria and hematuria.  Musculoskeletal: Negative for gait problem and neck pain.  Skin: Negative for color change.  Neurological: Negative for numbness and headaches.  Hematological: Negative for adenopathy.  Psychiatric/Behavioral: Negative for behavioral problems.  All other systems reviewed and are negative.      Allergies  Sulfa antibiotics  Home Medications   Current Outpatient Rx  Name  Route  Sig   Dispense  Refill  . aspirin 81 MG chewable tablet   Oral   Chew 81 mg by mouth at bedtime.         . valsartan-hydrochlorothiazide (DIOVAN-HCT) 80-12.5 MG per tablet   Oral   Take 1 tablet by mouth at bedtime and may repeat dose one time if needed.          BP 180/93  Pulse 90  Temp(Src) 98.1 F (36.7 C) (Oral)  Resp 18  SpO2 97% Physical Exam  Nursing note and vitals reviewed. Constitutional: He is oriented to person, place, and time. He appears well-developed and well-nourished.  HENT:  Head: Normocephalic and atraumatic.  Right Ear: External ear normal.  Left Ear: External ear normal.  Nose: Nose normal.  Mouth/Throat: Oropharynx is clear and moist. No oropharyngeal exudate.  Eyes: Conjunctivae and EOM are normal. Pupils are equal, round, and reactive to light.  Neck: Normal range of motion. Neck supple.  Cardiovascular: Normal rate, regular rhythm, normal heart sounds and intact distal pulses.  Exam reveals no gallop and no friction rub.   No murmur heard. Pulmonary/Chest: Effort normal and breath sounds normal. No respiratory distress. He has no wheezes.  Abdominal: Soft. Bowel sounds are normal. He exhibits no distension. There is no tenderness. There is no rebound and no guarding.  Musculoskeletal: Normal range of motion. He exhibits no edema and no tenderness.  Neurological: He is alert and oriented to person, place, and time. He has normal strength. No cranial nerve deficit or sensory deficit. He displays a negative Romberg sign. Coordination and gait normal.  alert, oriented x3 speech: normal in context  and clarity memory: intact grossly cranial nerves II-XII: intact motor strength: full proximally and distally no involuntary movements or tremors sensation: intact to light touch diffusely  cerebellar: finger-to-nose and heel-to-shin intact gait: normal forwards and backwards, normal tandem gait   Skin: Skin is warm and dry.  Psychiatric: He has a normal mood  and affect. His behavior is normal.    ED Course  Procedures (including critical care time) Labs Review Labs Reviewed  URINALYSIS, ROUTINE W REFLEX MICROSCOPIC - Abnormal; Notable for the following:    Nitrite POSITIVE (*)    Leukocytes, UA MODERATE (*)    All other components within normal limits  CBC - Abnormal; Notable for the following:    Hemoglobin 12.5 (*)    HCT 37.3 (*)    All other components within normal limits  COMPREHENSIVE METABOLIC PANEL - Abnormal; Notable for the following:    GFR calc non Af Amer 80 (*)    All other components within normal limits  HEMOGLOBIN A1C - Abnormal; Notable for the following:    Hemoglobin A1C 5.8 (*)    Mean Plasma Glucose 120 (*)    All other components within normal limits  URINE MICROSCOPIC-ADD ON - Abnormal; Notable for the following:    Bacteria, UA MANY (*)    All other components within normal limits  URINE RAPID DRUG SCREEN (HOSP PERFORMED)  PROTIME-INR  APTT  TROPONIN I  LIPID PANEL  CBG MONITORING, ED   Imaging Review Ct Head Wo Contrast  12/24/2013   CLINICAL DATA:  Slurred speech, history of stroke 1 year ago.  EXAM: CT HEAD WITHOUT CONTRAST  TECHNIQUE: Contiguous axial images were obtained from the base of the skull through the vertex without intravenous contrast.  COMPARISON:  CT HEAD W/O CM dated 07/04/2012  FINDINGS: The ventricles and sulci are normal for age. No intraparenchymal hemorrhage, mass effect nor midline shift. Patchy supratentorial white matter hypodensities are less than expected for patient's age and though non-specific suggest sequelae of chronic small vessel ischemic disease. No acute large vascular territory infarcts.  No abnormal extra-axial fluid collections. Basal cisterns are patent. Moderate calcific atherosclerosis of the carotid siphons.  No skull fracture. Similar right frontal 13 mm scalp nodule could reflect sebaceous cyst. Visualized paranasal sinuses and mastoid aircells are well-aerated,  improved. The included ocular globes and orbital contents are non-suspicious. Moderate temporomandibular osteoarthrosis.  IMPRESSION: No acute intracranial process. If clinical concern for acute ischemia, MRI of the brain with diffusion weighted sequences would be more sensitive.   Electronically Signed   By: Awilda Metro   On: 12/24/2013 18:10     EKG Interpretation   Date/Time:  Thursday December 24 2013 16:50:29 EDT Ventricular Rate:  75 PR Interval:  145 QRS Duration: 97 QT Interval:  398 QTC Calculation: 444 R Axis:   65 Text Interpretation:  Sinus rhythm Paired ventricular premature complexes  Confirmed by Dorell Gatlin  MD, Secily Walthour ([id number]) on 12/24/2013 11:30:31 PM      MDM   Final diagnoses:  UTI (lower urinary tract infection)  Expressive aphasia    5:44 PM 73 y.o. male who presents with an expressive aphasia. He states that he woke at 6 AM this morning. At approximately 6:30 AM when he began speaking to his daughter she noticed that his words were jumbled. He believes his symptoms lasted approximately 20 minutes and then resolved. He denies any weakness or numbness. He states that he is otherwise been well. He is been out of his blood pressure  medicine for approximately one week. He is afebrile and hypertensive here. Will workup for TIA. Currently he is neurologically intact on exam.  Found to have UTI. Ordered rocephin. Pt does not frequency at home. [facility] to [facility].   Shane Argyle, MD 12/24/13 [phone]

## 2013-12-24 NOTE — ED Notes (Signed)
[facility] transporting patient to [facility] at this time.

## 2013-12-24 NOTE — ED Notes (Signed)
0630 Pt was speaking but not making sense per wife. Pt was seen in Oct 2013, dx mini stroke. Pt is speaking in complete sentences and words are not jumbled. Alert to person, place, and situation. Disoriented to time.

## 2013-12-24 NOTE — ED Notes (Signed)
[facility] called for transport. 

## 2013-12-24 NOTE — ED Notes (Signed)
[facility] Ohmhris[facility]

## 2013-12-25 ENCOUNTER — Observation Stay (HOSPITAL_COMMUNITY): Payer: Medicare Other

## 2013-12-25 LAB — COMPREHENSIVE METABOLIC PANEL
ALBUMIN: 4.1 g/dL (ref 3.5–5.2)
ALK PHOS: 78 U/L (ref 39–117)
ALT: 14 U/L (ref 0–53)
AST: 21 U/L (ref 0–37)
BUN: 15 mg/dL (ref 6–23)
CHLORIDE: 100 meq/L (ref 96–112)
CO2: 28 mEq/L (ref 19–32)
Calcium: 10.5 mg/dL (ref 8.4–10.5)
Creatinine, Ser: 0.89 mg/dL (ref 0.50–1.35)
GFR calc Af Amer: >90 mL/min (ref >90)
GFR calc non Af Amer: 83 mL/min — ABNORMAL LOW (ref >90)
Glucose, Bld: 96 mg/dL (ref 70–99)
POTASSIUM: 4.9 meq/L (ref 3.7–5.3)
Sodium: 142 mEq/L (ref 137–147)
Total Bilirubin: 0.6 mg/dL (ref 0.3–1.2)
Total Protein: 8.1 g/dL (ref 6.0–8.3)

## 2013-12-25 LAB — CBC WITH DIFFERENTIAL/PLATELET
BASOS PCT: 0 % (ref 0–1)
Basophils Absolute: 0 10*3/uL (ref 0.0–0.1)
Eosinophils Absolute: 0.4 10*3/uL (ref 0.0–0.7)
Eosinophils Relative: 4 % (ref 0–5)
HCT: 39.9 % (ref 39.0–52.0)
HEMOGLOBIN: 13.5 g/dL (ref 13.0–17.0)
Lymphocytes Relative: 25 % (ref 12–46)
Lymphs Abs: 2 10*3/uL (ref 0.7–4.0)
MCH: 28.3 pg (ref 26.0–34.0)
MCHC: 33.8 g/dL (ref 30.0–36.0)
MCV: 83.6 fL (ref 78.0–100.0)
Monocytes Absolute: 0.9 10*3/uL (ref 0.1–1.0)
Monocytes Relative: 12 % (ref 3–12)
NEUTROS PCT: 59 % (ref 43–77)
Neutro Abs: 4.7 10*3/uL (ref 1.7–7.7)
Platelets: 360 10*3/uL (ref 150–400)
RBC: 4.77 MIL/uL (ref 4.22–5.81)
RDW: 14.3 % (ref 11.5–15.5)
WBC: 8.1 10*3/uL (ref 4.0–10.5)

## 2013-12-25 LAB — LIPID PANEL
CHOL/HDL RATIO: 3.9 ratio
Cholesterol: 245 mg/dL — ABNORMAL HIGH (ref 0–200)
HDL: 63 mg/dL (ref >39)
LDL CALC: 169 mg/dL — AB (ref 0–99)
Triglycerides: 67 mg/dL (ref <150)
VLDL: 13 mg/dL (ref 0–40)

## 2013-12-25 MED ORDER — STROKE: EARLY STAGES OF RECOVERY BOOK
Freq: Once | Status: AC
  Administered 2013-12-25: 13:00:00
  Filled 2013-12-25: qty 1

## 2013-12-25 MED ORDER — SIMVASTATIN 20 MG PO TABS: 20.0000 mg | ORAL_TABLET | Freq: Every day | ORAL | Status: AC

## 2013-12-25 MED ORDER — LEVOFLOXACIN 500 MG PO TABS: 500.0000 mg | ORAL_TABLET | Freq: Every day | ORAL | Status: DC

## 2013-12-25 MED ORDER — LEVOFLOXACIN 500 MG PO TABS: 500.0000 mg | ORAL_TABLET | Freq: Every day | ORAL | Status: AC

## 2013-12-25 NOTE — Progress Notes (Signed)
OT Cancellation Note  Patient Details Name: Shane Molina MRN: [id number] DOB: 21-Sep-1941   Cancelled Treatment:    Reason Eval/Treat Not Completed: OT screened, no needs identified, will sign off. Pt ambulating around room and picking objects up off floor without LOB. Wife present and reports pt is functioning at baseline. Pt also agrees that he is at baseline.  OT to sign off.  12/25/2013 Cipriano MileJohnson, Beauty Pless Elizabeth OTR/L Pager [phone] Office [phone]

## 2013-12-25 NOTE — Progress Notes (Signed)
Utilization review completed.  

## 2013-12-25 NOTE — Progress Notes (Signed)
[facility] PROGRESS NOTE  Shane CoveyRaymond E Jurewicz [id number] DOB: Aug 02, 1941 DOA: 12/24/2013 PCP: No primary provider on file.  Assessment/Plan: Principal Problem:  TIA (transient ischemic attack)  Active Problems:  Hypertension  Expressive aphasia  73 y/o male with PMH of HTN, TIAs presented with slurred speech admitted with TIA, UTI  1. TIA; ? Worse with UTI; symptoms resolved; neuro exam non focal; CT head: no acute findings;  -TIA work up orders, appreciate neurology input   2. UTI, afebrile; no leukocytosis;  - started IV atx; pend cultures   3. HTN cont home regimen; titrate per response   Possible d/c today if okay with neurology   Code Status: full Family Communication: d/w patient, his wife (indicate person spoken with, relationship, and if by phone, the number) Disposition Plan: home 24-48 hours    Consultants:  Neurology   Procedures:  Pend MRI  Antibiotics:  Ceftriaxone 3/26<<<<   (indicate start date, and stop date if known)  HPI/Subjective: alert  Objective: Filed Vitals:   12/25/13 0400  BP: 146/63  Pulse: 62  Temp: 98.3 F (36.8 C)  Resp: 18    Intake/Output Summary (Last 24 hours) at 12/25/13 0753 Last data filed at 12/25/13 0453  Gross per 24 hour  Intake      0 ml  Output    450 ml  Net   -450 ml   Filed Weights   12/24/13 2210 12/25/13 0400  Weight: 68.584 kg (151 lb 3.2 oz) 68.499 kg (151 lb 0.2 oz)    Exam:   General:  alert  Cardiovascular: s1,s2 rrr  Respiratory: CTA BL  Abdomen: soft, nt,nd   Musculoskeletal: no LE edema   Data Reviewed: Basic Metabolic Panel:  Recent Labs Lab 12/24/13 1745 12/25/13 0554  NA 141 142  K 4.1 4.9  CL 102 100  CO2 26 28  GLUCOSE 96 96  BUN 15 15  CREATININE 0.99 0.89  CALCIUM 10.1 10.5   Liver Function Tests:  Recent Labs Lab 12/24/13 1745 12/25/13 0554  AST 21 21  ALT 14 14  ALKPHOS 75 78  BILITOT 0.5 0.6  PROT 7.9 8.1  ALBUMIN 4.2 4.1   No results  found for this basename: LIPASE, AMYLASE,  in the last 168 hours No results found for this basename: AMMONIA,  in the last 168 hours CBC:  Recent Labs Lab 12/24/13 1745 12/25/13 0554  WBC 8.0 8.1  NEUTROABS  --  4.7  HGB 12.5* 13.5  HCT 37.3* 39.9  MCV 82.0 83.6  PLT 339 360   Cardiac Enzymes:  Recent Labs Lab 12/24/13 1745  TROPONINI <0.30   BNP (last 3 results) No results found for this basename: PROBNP,  in the last 8760 hours CBG:  Recent Labs Lab 12/24/13 1710  GLUCAP 89    No results found for this or any previous visit (from the past 240 hour(s)).   Studies: Ct Head Wo Contrast  12/24/2013   CLINICAL DATA:  Slurred speech, history of stroke 1 year ago.  EXAM: CT HEAD WITHOUT CONTRAST  TECHNIQUE: Contiguous axial images were obtained from the base of the skull through the vertex without intravenous contrast.  COMPARISON:  CT HEAD W/O CM dated 07/04/2012  FINDINGS: The ventricles and sulci are normal for age. No intraparenchymal hemorrhage, mass effect nor midline shift. Patchy supratentorial white matter hypodensities are less than expected for patient's age and though non-specific suggest sequelae of chronic small vessel ischemic disease. No acute large vascular territory infarcts.  No  abnormal extra-axial fluid collections. Basal cisterns are patent. Moderate calcific atherosclerosis of the carotid siphons.  No skull fracture. Similar right frontal 13 mm scalp nodule could reflect sebaceous cyst. Visualized paranasal sinuses and mastoid aircells are well-aerated, improved. The included ocular globes and orbital contents are non-suspicious. Moderate temporomandibular osteoarthrosis.  IMPRESSION: No acute intracranial process. If clinical concern for acute ischemia, MRI of the brain with diffusion weighted sequences would be more sensitive.   Electronically Signed   By: Awilda Metro   On: 12/24/2013 18:10    Scheduled Meds: . aspirin  325 mg Oral Daily  .  cefTRIAXone (ROCEPHIN)  IV  1 g Intravenous Q24H  . enoxaparin (LOVENOX) injection  40 mg Subcutaneous QHS   Continuous Infusions:   Principal Problem:   TIA (transient ischemic attack) Active Problems:   Hypertension   Expressive aphasia    Time spent: >35 minutes     Esperanza Sheets  [facility] Pager [phone]. If 7PM-7AM, please contact night-coverage at [email or web address], password [facility] 12/25/2013, 7:53 AM  LOS: 1 day

## 2013-12-25 NOTE — Progress Notes (Signed)
*  PRELIMINARY RESULTS* Vascular Ultrasound Carotid Duplex (Doppler) has been completed.  Preliminary findings: Right = 40-59% ICA stenosis. Moderate ECA stenosis. Left = 1-39% ICA stenosis.   Farrel DemarkJill Eunice, RDMS, RVT  12/25/2013, 11:56 AM

## 2013-12-25 NOTE — Progress Notes (Signed)
Pt discharged to home per MD order. Pt and wife received and reviewed all discharge instructions and medication information including follow-up appointments, stroke education and prescription information. Pt  Verbalized understanding. Pt IV and telemetry box removed prior to discharge. Pt alert and oriented at discharge with no complaints of pain.   Pt ambulated to private vehicle per pt request. Shane Molina, Gret[place] Bergin C

## 2013-12-25 NOTE — Progress Notes (Signed)
PT Cancellation Note  Patient Details Name: Shane Molina MRN: [id number] DOB: 1941-09-05   Cancelled Treatment:    Reason Eval/Treat Not Completed: OT screened, no needs identified, will sign off .  OT screened for PT needs.  No needs identified.  PT to sign off.    Thanks,    Rollene Rotundaebecca B. Kennice Finnie, PT, DPT [phone]   12/25/2013, 12:16 PM

## 2013-12-25 NOTE — Discharge Summary (Signed)
Physician Discharge Summary  Shane CoveyRaymond E Molina NFA:[id number] DOB: Apr 09, 1941 DOA: 12/24/2013  PCP: No primary provider on file.  Admit date: 12/24/2013 Discharge date: 12/25/2013  Time spent: >35 minutes  Recommendations for Outpatient Follow-up:  F/u with PCP in 1-2 weeks  Discharge Diagnoses:  Principal Problem:   TIA (transient ischemic attack) Active Problems:   Hypertension   Expressive aphasia   Discharge Condition: stable   Diet recommendation: heart healthy   Filed Weights   12/24/13 2210 12/25/13 0400  Weight: 68.584 kg (151 lb 3.2 oz) 68.499 kg (151 lb 0.2 oz)    History of present illness:  Principal Problem:  TIA (transient ischemic attack)  Active Problems:  Hypertension  Expressive aphasia   73 y/o male with PMH of HTN, TIAs presented with slurred speech admitted with TIA, UTI   Hospital Course:  1. TIA; ? Worse with UTI;  -symptoms resolved; neuro exam non focal; CT head: no acute findings; MRI head no acute findings; cont ASA, added statin, pend echo at the time of discharge; patient would like to f/u outpatient  2. UTI, afebrile; no leukocytosis;  - initially started IV atx; changed to PO upon discharge; f/u cultures outpatient  3. HTN cont home regimen; titrate per response       Procedures:  MRI (i.e. Studies not automatically included, echos, thoracentesis, etc; not x-rays)  Consultations:  Neurology   Discharge Exam: Filed Vitals:   12/25/13 1330  BP: 149/65  Pulse: 68  Temp: 98 F (36.7 C)  Resp: 20    General: alert Cardiovascular: s1,s2 rrr Respiratory: CTA BL  Discharge Instructions  Discharge Orders   Future Orders Complete By Expires   Diet - low sodium heart healthy  As directed    Discharge instructions  As directed    Comments:     Please follow up with primary care doctor in 1-2 weeks   Increase activity slowly  As directed        Medication List         aspirin 81 MG chewable tablet  Chew 81 mg by mouth  at bedtime.     levofloxacin 500 MG tablet  Commonly known as:  LEVAQUIN  Take 1 tablet (500 mg total) by mouth daily.     simvastatin 20 MG tablet  Commonly known as:  ZOCOR  Take 1 tablet (20 mg total) by mouth daily.     valsartan-hydrochlorothiazide 80-12.5 MG per tablet  Commonly known as:  DIOVAN-HCT  Take 1 tablet by mouth at bedtime and may repeat dose one time if needed.       Allergies  Allergen Reactions  . Sulfa Antibiotics Rash      The results of significant diagnostics from this hospitalization (including imaging, microbiology, ancillary and laboratory) are listed below for reference.    Significant Diagnostic Studies: Ct Head Wo Contrast  12/24/2013   CLINICAL DATA:  Slurred speech, history of stroke 1 year ago.  EXAM: CT HEAD WITHOUT CONTRAST  TECHNIQUE: Contiguous axial images were obtained from the base of the skull through the vertex without intravenous contrast.  COMPARISON:  CT HEAD W/O CM dated 07/04/2012  FINDINGS: The ventricles and sulci are normal for age. No intraparenchymal hemorrhage, mass effect nor midline shift. Patchy supratentorial white matter hypodensities are less than expected for patient's age and though non-specific suggest sequelae of chronic small vessel ischemic disease. No acute large vascular territory infarcts.  No abnormal extra-axial fluid collections. Basal cisterns are patent. Moderate calcific atherosclerosis of  the carotid siphons.  No skull fracture. Similar right frontal 13 mm scalp nodule could reflect sebaceous cyst. Visualized paranasal sinuses and mastoid aircells are well-aerated, improved. The included ocular globes and orbital contents are non-suspicious. Moderate temporomandibular osteoarthrosis.  IMPRESSION: No acute intracranial process. If clinical concern for acute ischemia, MRI of the brain with diffusion weighted sequences would be more sensitive.   Electronically Signed   By: Awilda Metro   On: 12/24/2013 18:10    Mri Brain Without Contrast  12/25/2013   CLINICAL DATA:  TIA with expressive aphasia.  EXAM: MRI HEAD WITHOUT CONTRAST  MRA HEAD WITHOUT CONTRAST  TECHNIQUE: Multiplanar, multiecho pulse sequences of the brain and surrounding structures were obtained without intravenous contrast. Angiographic images of the head were obtained using MRA technique without contrast.  COMPARISON:  CT HEAD W/O CM dated 12/24/2013  FINDINGS: MRI HEAD FINDINGS  No evidence for acute infarction, hemorrhage, mass lesion, hydrocephalus, or extra-axial fluid. Mild cerebral and cerebellar atrophy. Mild subcortical and periventricular T2 and FLAIR hyperintensities, likely chronic microvascular ischemic change. Flow voids are maintained throughout the carotid, basilar, and vertebral arteries. There are no areas of chronic hemorrhage. Pituitary, pineal, and cerebellar tonsils unremarkable. No upper cervical lesions. Visualized calvarium, skull base, and upper cervical osseous structures unremarkable. Scalp and extracranial soft tissues, orbits, sinuses, and mastoids show no acute process. Incidental sebaceous cyst right scalp.  MRA HEAD FINDINGS  The internal carotid arteries are widely patent. The basilar artery is widely patent with vertebrals codominant and widely patent. No proximal ACA or MCA stenosis. Fetal origin left PCA with focal narrowing of the right and left P2 segments approaching 75%. No distal MCA stenosis or occlusion. No intracranial aneurysm. No cerebellar branch occlusion.  IMPRESSION: No acute stroke is evident.  Mild atrophy with small vessel disease.  No proximal carotid, vertebral, or basilar stenosis.   Electronically Signed   By: Davonna Belling M.D.   On: 12/25/2013 09:35   Mr Maxine Glenn Head/brain Wo Cm  12/25/2013   CLINICAL DATA:  TIA with expressive aphasia.  EXAM: MRI HEAD WITHOUT CONTRAST  MRA HEAD WITHOUT CONTRAST  TECHNIQUE: Multiplanar, multiecho pulse sequences of the brain and surrounding structures were obtained  without intravenous contrast. Angiographic images of the head were obtained using MRA technique without contrast.  COMPARISON:  CT HEAD W/O CM dated 12/24/2013  FINDINGS: MRI HEAD FINDINGS  No evidence for acute infarction, hemorrhage, mass lesion, hydrocephalus, or extra-axial fluid. Mild cerebral and cerebellar atrophy. Mild subcortical and periventricular T2 and FLAIR hyperintensities, likely chronic microvascular ischemic change. Flow voids are maintained throughout the carotid, basilar, and vertebral arteries. There are no areas of chronic hemorrhage. Pituitary, pineal, and cerebellar tonsils unremarkable. No upper cervical lesions. Visualized calvarium, skull base, and upper cervical osseous structures unremarkable. Scalp and extracranial soft tissues, orbits, sinuses, and mastoids show no acute process. Incidental sebaceous cyst right scalp.  MRA HEAD FINDINGS  The internal carotid arteries are widely patent. The basilar artery is widely patent with vertebrals codominant and widely patent. No proximal ACA or MCA stenosis. Fetal origin left PCA with focal narrowing of the right and left P2 segments approaching 75%. No distal MCA stenosis or occlusion. No intracranial aneurysm. No cerebellar branch occlusion.  IMPRESSION: No acute stroke is evident.  Mild atrophy with small vessel disease.  No proximal carotid, vertebral, or basilar stenosis.   Electronically Signed   By: Davonna Belling M.D.   On: 12/25/2013 09:35    Microbiology: No results found for this  or any previous visit (from the past 240 hour(s)).   Labs: Basic Metabolic Panel:  Recent Labs Lab 12/24/13 1745 12/25/13 0554  NA 141 142  K 4.1 4.9  CL 102 100  CO2 26 28  GLUCOSE 96 96  BUN 15 15  CREATININE 0.99 0.89  CALCIUM 10.1 10.5   Liver Function Tests:  Recent Labs Lab 12/24/13 1745 12/25/13 0554  AST 21 21  ALT 14 14  ALKPHOS 75 78  BILITOT 0.5 0.6  PROT 7.9 8.1  ALBUMIN 4.2 4.1   No results found for this basename:  LIPASE, AMYLASE,  in the last 168 hours No results found for this basename: AMMONIA,  in the last 168 hours CBC:  Recent Labs Lab 12/24/13 1745 12/25/13 0554  WBC 8.0 8.1  NEUTROABS  --  4.7  HGB 12.5* 13.5  HCT 37.3* 39.9  MCV 82.0 83.6  PLT 339 360   Cardiac Enzymes:  Recent Labs Lab 12/24/13 1745  TROPONINI <0.30   BNP: BNP (last 3 results) No results found for this basename: PROBNP,  in the last 8760 hours CBG:  Recent Labs Lab 12/24/13 1710  GLUCAP 89       Signed:  Heriberto Stmartin N  [facility] 12/25/2013, 2:30 PM

## 2013-12-25 NOTE — Discharge Instructions (Signed)
STROKE/TIA DISCHARGE INSTRUCTIONS SMOKING Cigarette smoking nearly doubles your risk of having a stroke & is the single most alterable risk factor  If you smoke or have smoked in the last 12 months, you are advised to quit smoking for your health.  Most of the excess cardiovascular risk related to smoking disappears within a year of stopping.  Ask you doctor about anti-smoking medications  [place] Quit Line: 1-800-QUIT NOW  Free Smoking Cessation Classes [phone]  CHOLESTEROL Know your levels; limit fat & cholesterol in your diet  Lipid Panel     Component Value Date/Time   CHOL 245* 12/25/2013 0355   TRIG 67 12/25/2013 0355   HDL 63 12/25/2013 0355   CHOLHDL 3.9 12/25/2013 0355   VLDL 13 12/25/2013 0355   LDLCALC 169* 12/25/2013 0355      Many patients benefit from treatment even if their cholesterol is at goal.  Goal: Total Cholesterol (CHOL) less than 160  Goal:  Triglycerides (TRIG) less than 150  Goal:  HDL greater than 40  Goal:  LDL (LDLCALC) less than 100   BLOOD PRESSURE American Stroke Association blood pressure target is less that 120/80 mm/Hg  Your discharge blood pressure is:  BP: 149/65 mmHg  Monitor your blood pressure  Limit your salt and alcohol intake  Many individuals will require more than one medication for high blood pressure  DIABETES (A1c is a blood sugar average for last 3 months) Goal HGBA1c is under 7% (HBGA1c is blood sugar average for last 3 months)  Diabetes: No known diagnosis of diabetes    Lab Results  Component Value Date   HGBA1C 5.8* 12/24/2013     Your HGBA1c can be lowered with medications, healthy diet, and exercise.  Check your blood sugar as directed by your physician  Call your physician if you experience unexplained or low blood sugars.  PHYSICAL ACTIVITY/REHABILITATION Goal is 30 minutes at least 4 days per week  Activity: No restrictions. and Increase activity slowly, Therapies: Physical Therapy: None Return to work: N/A   Activity decreases your risk of heart attack and stroke and makes your heart stronger.  It helps control your weight and blood pressure; helps you relax and can improve your mood.  Participate in a regular exercise program.  Talk with your doctor about the best form of exercise for you (dancing, walking, swimming, cycling).  DIET/WEIGHT Goal is to maintain a healthy weight  Your discharge diet is: Cardiac Heart Healthy Your height is:  Height: 5\' 8"  (172.7 cm) Your current weight is: Weight: 68.499 kg (151 lb 0.2 oz) Your Body Mass Index (BMI) is:  BMI (Calculated): 23  Following the type of diet specifically designed for you will help prevent another stroke.  Your goal weight range is:  125-158 lbs  Your goal Body Mass Index (BMI) is 19-24.  Healthy food habits can help reduce 3 risk factors for stroke:  High cholesterol, hypertension, and excess weight.  RESOURCES Stroke/Support Group:  Call [phone]   STROKE EDUCATION PROVIDED/REVIEWED AND GIVEN TO PATIENT Stroke warning signs and symptoms How to activate emergency medical system (call 911). Medications prescribed at discharge. Need for follow-up after discharge. Personal risk factors for stroke. Pneumonia vaccine given: No Flu vaccine given: No My questions have been answered, the writing is legible, and I understand these instructions.  I will adhere to these goals & educational materials that have been provided to me after my discharge from the hospital.

## 2013-12-25 NOTE — Progress Notes (Signed)
NEURO HOSPITALIST PROGRESS NOTE   SUBJECTIVE:                                                                                                                        Doing well, no neurological complains. Anxious to go home. Wife at the bedside. Cholesterol 245, LDL 169, HDL 63. MRI/MRA brain unimpressive. TTE and CUS pending. On aspirin.  OBJECTIVE:                                                                                                                           Vital signs in last 24 hours: Temp:  [98.1 F (36.7 C)-98.5 F (36.9 C)] 98.3 F (36.8 C) (03/27 0400) Pulse Rate:  [51-90] 62 (03/27 0400) Resp:  [18-22] 18 (03/27 0400) BP: (146-181)/(63-93) 146/63 mmHg (03/27 0400) SpO2:  [96 %-100 %] 99 % (03/27 0400) Weight:  [68.499 kg (151 lb 0.2 oz)-68.584 kg (151 lb 3.2 oz)] 68.499 kg (151 lb 0.2 oz) (03/27 0400)  Intake/Output from previous day: 03/26 0701 - 03/27 0700 In: -  Out: 450 [Urine:450] Intake/Output this shift:   Nutritional status: Cardiac  Past Medical History  Diagnosis Date  . Stroke     Neurologic Exam:  Mental Status:  Alert, oriented, thought content appropriate. Speech fluent without evidence of aphasia. Able to follow 3 step commands without difficulty.  Cranial Nerves:  II: Discs flat bilaterally; Visual fields grossly normal, pupils equal, round, reactive to light and accommodation  III,IV, VI: ptosis not present, extra-ocular motions intact bilaterally  V,VII: smile symmetric, facial light touch sensation normal bilaterally  VIII: hearing normal bilaterally  IX,X: gag reflex present  XI: bilateral shoulder shrug  XII: midline tongue extension  Motor:  Right : Upper extremity 5/5 Left: Upper extremity 5/5  Lower extremity 5/5 Lower extremity 5/5  Tone and bulk:normal tone throughout; no atrophy noted  Sensory: Pinprick and light touch intact throughout, bilaterally  Deep Tendon Reflexes: 2+ and  symmetric throughout  Plantars:  Right: downgoing Left: downgoing  Cerebellar:  normal finger-to-nose and normal heel-to-shin test  Gait: Unable to test  CV: pulses palpable throughout    Lab Results: Lab Results  Component Value Date/Time   CHOL 245* 12/25/2013  3:55 AM  Lipid Panel  Recent Labs  12/25/13 0355  CHOL 245*  TRIG 67  HDL 63  CHOLHDL 3.9  VLDL 13  LDLCALC 161169*    Studies/Results: Ct Head Wo Contrast  12/24/2013   CLINICAL DATA:  Slurred speech, history of stroke 1 year ago.  EXAM: CT HEAD WITHOUT CONTRAST  TECHNIQUE: Contiguous axial images were obtained from the base of the skull through the vertex without intravenous contrast.  COMPARISON:  CT HEAD W/O CM dated 07/04/2012  FINDINGS: The ventricles and sulci are normal for age. No intraparenchymal hemorrhage, mass effect nor midline shift. Patchy supratentorial white matter hypodensities are less than expected for patient's age and though non-specific suggest sequelae of chronic small vessel ischemic disease. No acute large vascular territory infarcts.  No abnormal extra-axial fluid collections. Basal cisterns are patent. Moderate calcific atherosclerosis of the carotid siphons.  No skull fracture. Similar right frontal 13 mm scalp nodule could reflect sebaceous cyst. Visualized paranasal sinuses and mastoid aircells are well-aerated, improved. The included ocular globes and orbital contents are non-suspicious. Moderate temporomandibular osteoarthrosis.  IMPRESSION: No acute intracranial process. If clinical concern for acute ischemia, MRI of the brain with diffusion weighted sequences would be more sensitive.   Electronically Signed   By: Awilda Metroourtnay  Bloomer   On: 12/24/2013 18:10   Mri Brain Without Contrast  12/25/2013   CLINICAL DATA:  TIA with expressive aphasia.  EXAM: MRI HEAD WITHOUT CONTRAST  MRA HEAD WITHOUT CONTRAST  TECHNIQUE: Multiplanar, multiecho pulse sequences of the brain and surrounding structures were  obtained without intravenous contrast. Angiographic images of the head were obtained using MRA technique without contrast.  COMPARISON:  CT HEAD W/O CM dated 12/24/2013  FINDINGS: MRI HEAD FINDINGS  No evidence for acute infarction, hemorrhage, mass lesion, hydrocephalus, or extra-axial fluid. Mild cerebral and cerebellar atrophy. Mild subcortical and periventricular T2 and FLAIR hyperintensities, likely chronic microvascular ischemic change. Flow voids are maintained throughout the carotid, basilar, and vertebral arteries. There are no areas of chronic hemorrhage. Pituitary, pineal, and cerebellar tonsils unremarkable. No upper cervical lesions. Visualized calvarium, skull base, and upper cervical osseous structures unremarkable. Scalp and extracranial soft tissues, orbits, sinuses, and mastoids show no acute process. Incidental sebaceous cyst right scalp.  MRA HEAD FINDINGS  The internal carotid arteries are widely patent. The basilar artery is widely patent with vertebrals codominant and widely patent. No proximal ACA or MCA stenosis. Fetal origin left PCA with focal narrowing of the right and left P2 segments approaching 75%. No distal MCA stenosis or occlusion. No intracranial aneurysm. No cerebellar branch occlusion.  IMPRESSION: No acute stroke is evident.  Mild atrophy with small vessel disease.  No proximal carotid, vertebral, or basilar stenosis.   Electronically Signed   By: Davonna BellingJohn  Curnes M.D.   On: 12/25/2013 09:35   Mr Maxine GlennMra Head/brain Wo Cm  12/25/2013   CLINICAL DATA:  TIA with expressive aphasia.  EXAM: MRI HEAD WITHOUT CONTRAST  MRA HEAD WITHOUT CONTRAST  TECHNIQUE: Multiplanar, multiecho pulse sequences of the brain and surrounding structures were obtained without intravenous contrast. Angiographic images of the head were obtained using MRA technique without contrast.  COMPARISON:  CT HEAD W/O CM dated 12/24/2013  FINDINGS: MRI HEAD FINDINGS  No evidence for acute infarction, hemorrhage, mass lesion,  hydrocephalus, or extra-axial fluid. Mild cerebral and cerebellar atrophy. Mild subcortical and periventricular T2 and FLAIR hyperintensities, likely chronic microvascular ischemic change. Flow voids are maintained throughout the carotid, basilar, and vertebral arteries. There are no areas of chronic hemorrhage.  Pituitary, pineal, and cerebellar tonsils unremarkable. No upper cervical lesions. Visualized calvarium, skull base, and upper cervical osseous structures unremarkable. Scalp and extracranial soft tissues, orbits, sinuses, and mastoids show no acute process. Incidental sebaceous cyst right scalp.  MRA HEAD FINDINGS  The internal carotid arteries are widely patent. The basilar artery is widely patent with vertebrals codominant and widely patent. No proximal ACA or MCA stenosis. Fetal origin left PCA with focal narrowing of the right and left P2 segments approaching 75%. No distal MCA stenosis or occlusion. No intracranial aneurysm. No cerebellar branch occlusion.  IMPRESSION: No acute stroke is evident.  Mild atrophy with small vessel disease.  No proximal carotid, vertebral, or basilar stenosis.   Electronically Signed   By: Davonna Belling M.D.   On: 12/25/2013 09:35    MEDICATIONS                                                                                                                       I have reviewed the patient's current medications.  ASSESSMENT/PLAN:                                                                                                           73 y/o with HTN, prior stroke, admitted due to transient 30 minutes episode of speech impairment concerning for TIA. Non focal neuro-exam and unremarkable neuro-imaging. Awaiting TTE, CUS. Consider adding a statin if no contraindication. UTI management as per internal medicine. Will follow up.   Wyatt Portela, MD [facility] [phone]  12/25/2013, 10:14 AM

## 2013-12-25 NOTE — Progress Notes (Signed)
*  PRELIMINARY RESULTS* Echocardiogram 2D Echocardiogram has been performed.  Jeryl ColumbiaLLIOTT, Jeffrey Graefe 12/25/2013, 12:21 PM

## 2014-09-12 ENCOUNTER — Emergency Department (HOSPITAL_COMMUNITY): Payer: Medicare Other

## 2014-09-12 ENCOUNTER — Emergency Department (HOSPITAL_COMMUNITY)
Admission: EM | Admit: 2014-09-12 | Discharge: 2014-09-12 | Disposition: A | Discharge status: Home / Self Care | Payer: Medicare Other | Attending: Emergency Medicine | Admitting: Emergency Medicine

## 2014-09-12 ENCOUNTER — Encounter (HOSPITAL_COMMUNITY): Payer: Self-pay | Admitting: Emergency Medicine

## 2014-09-12 ENCOUNTER — Encounter (HOSPITAL_COMMUNITY)
Admission: EM | Disposition: A | Discharge status: Home / Self Care | Payer: Medicare Other | Source: Home / Self Care | Attending: Emergency Medicine

## 2014-09-12 ENCOUNTER — Ambulatory Visit (HOSPITAL_COMMUNITY): Admit: 2014-09-12 | Payer: Self-pay | Admitting: Cardiovascular Disease

## 2014-09-12 DIAGNOSIS — I213 ST elevation (STEMI) myocardial infarction of unspecified site: Secondary | ICD-10-CM

## 2014-09-12 DIAGNOSIS — I442 Atrioventricular block, complete: Secondary | ICD-10-CM | POA: Insufficient documentation

## 2014-09-12 DIAGNOSIS — I2121 ST elevation (STEMI) myocardial infarction involving left circumflex coronary artery: Secondary | ICD-10-CM

## 2014-09-12 DIAGNOSIS — I10 Essential (primary) hypertension: Secondary | ICD-10-CM | POA: Insufficient documentation

## 2014-09-12 DIAGNOSIS — I2119 ST elevation (STEMI) myocardial infarction involving other coronary artery of inferior wall: Secondary | ICD-10-CM | POA: Diagnosis not present

## 2014-09-12 DIAGNOSIS — I2584 Coronary atherosclerosis due to calcified coronary lesion: Secondary | ICD-10-CM | POA: Insufficient documentation

## 2014-09-12 DIAGNOSIS — I2510 Atherosclerotic heart disease of native coronary artery without angina pectoris: Secondary | ICD-10-CM

## 2014-09-12 DIAGNOSIS — Z7982 Long term (current) use of aspirin: Secondary | ICD-10-CM | POA: Insufficient documentation

## 2014-09-12 DIAGNOSIS — R570 Cardiogenic shock: Secondary | ICD-10-CM

## 2014-09-12 DIAGNOSIS — R001 Bradycardia, unspecified: Secondary | ICD-10-CM | POA: Diagnosis not present

## 2014-09-12 DIAGNOSIS — E876 Hypokalemia: Secondary | ICD-10-CM | POA: Diagnosis not present

## 2014-09-12 DIAGNOSIS — I959 Hypotension, unspecified: Secondary | ICD-10-CM | POA: Insufficient documentation

## 2014-09-12 DIAGNOSIS — Z792 Long term (current) use of antibiotics: Secondary | ICD-10-CM | POA: Insufficient documentation

## 2014-09-12 DIAGNOSIS — Z79899 Other long term (current) drug therapy: Secondary | ICD-10-CM | POA: Insufficient documentation

## 2014-09-12 DIAGNOSIS — D649 Anemia, unspecified: Secondary | ICD-10-CM | POA: Diagnosis not present

## 2014-09-12 DIAGNOSIS — Z9889 Other specified postprocedural states: Secondary | ICD-10-CM

## 2014-09-12 DIAGNOSIS — Z87891 Personal history of nicotine dependence: Secondary | ICD-10-CM | POA: Diagnosis not present

## 2014-09-12 DIAGNOSIS — Z8673 Personal history of transient ischemic attack (TIA), and cerebral infarction without residual deficits: Secondary | ICD-10-CM | POA: Diagnosis not present

## 2014-09-12 DIAGNOSIS — E785 Hyperlipidemia, unspecified: Secondary | ICD-10-CM | POA: Diagnosis not present

## 2014-09-12 HISTORY — DX: Essential (primary) hypertension: I10

## 2014-09-12 HISTORY — PX: LEFT HEART CATHETERIZATION WITH CORONARY ANGIOGRAM: SHX5451

## 2014-09-12 HISTORY — DX: Hyperlipidemia, unspecified: E78.5

## 2014-09-12 LAB — BASIC METABOLIC PANEL
ANION GAP: 21 — AB (ref 5–15)
BUN: 19 mg/dL (ref 6–23)
CHLORIDE: 104 meq/L (ref 96–112)
CO2: 14 mEq/L — ABNORMAL LOW (ref 19–32)
CREATININE: 1.23 mg/dL (ref 0.50–1.35)
Calcium: 8.1 mg/dL — ABNORMAL LOW (ref 8.4–10.5)
GFR, EST AFRICAN AMERICAN: 65 mL/min — AB (ref >90)
GFR, EST NON AFRICAN AMERICAN: 56 mL/min — AB (ref >90)
Glucose, Bld: 236 mg/dL — ABNORMAL HIGH (ref 70–99)
POTASSIUM: 3.7 meq/L (ref 3.7–5.3)
Sodium: 139 mEq/L (ref 137–147)

## 2014-09-12 LAB — URINE MICROSCOPIC-ADD ON

## 2014-09-12 LAB — CBC
HCT: 31.9 % — ABNORMAL LOW (ref 39.0–52.0)
Hemoglobin: 10 g/dL — ABNORMAL LOW (ref 13.0–17.0)
MCH: 26.7 pg (ref 26.0–34.0)
MCHC: 31.3 g/dL (ref 30.0–36.0)
MCV: 85.1 fL (ref 78.0–100.0)
Platelets: 334 10*3/uL (ref 150–400)
RBC: 3.75 MIL/uL — ABNORMAL LOW (ref 4.22–5.81)
RDW: 14.9 % (ref 11.5–15.5)
WBC: 14.4 10*3/uL — ABNORMAL HIGH (ref 4.0–10.5)

## 2014-09-12 LAB — DIFFERENTIAL
Basophils Absolute: 0 10*3/uL (ref 0.0–0.1)
Basophils Relative: 0 % (ref 0–1)
EOS ABS: 0.5 10*3/uL (ref 0.0–0.7)
EOS PCT: 3 % (ref 0–5)
LYMPHS ABS: 5.5 10*3/uL — AB (ref 0.7–4.0)
Lymphocytes Relative: 38 % (ref 12–46)
Monocytes Absolute: 1.3 10*3/uL — ABNORMAL HIGH (ref 0.1–1.0)
Monocytes Relative: 9 % (ref 3–12)
Neutro Abs: 7.2 10*3/uL (ref 1.7–7.7)
Neutrophils Relative %: 50 % (ref 43–77)

## 2014-09-12 LAB — URINALYSIS, ROUTINE W REFLEX MICROSCOPIC
BILIRUBIN URINE: NEGATIVE
GLUCOSE, UA: NEGATIVE mg/dL
Ketones, ur: NEGATIVE mg/dL
Nitrite: NEGATIVE
PH: 5.5 (ref 5.0–8.0)
PROTEIN: 100 mg/dL — AB
Specific Gravity, Urine: 1.021 (ref 1.005–1.030)
Urobilinogen, UA: 0.2 mg/dL (ref 0.0–1.0)

## 2014-09-12 LAB — I-STAT CHEM 8, ED
BUN: 21 mg/dL (ref 6–23)
CALCIUM ION: 1.06 mmol/L — AB (ref 1.13–1.30)
CHLORIDE: 108 meq/L (ref 96–112)
Creatinine, Ser: 1.3 mg/dL (ref 0.50–1.35)
Glucose, Bld: 235 mg/dL — ABNORMAL HIGH (ref 70–99)
HEMATOCRIT: 32 % — AB (ref 39.0–52.0)
Hemoglobin: 10.9 g/dL — ABNORMAL LOW (ref 13.0–17.0)
POTASSIUM: 3.3 meq/L — AB (ref 3.7–5.3)
Sodium: 140 mEq/L (ref 137–147)
TCO2: 16 mmol/L (ref 0–100)

## 2014-09-12 LAB — I-STAT TROPONIN, ED: TROPONIN I, POC: 0.7 ng/mL — AB (ref 0.00–0.08)

## 2014-09-12 LAB — PROTIME-INR
INR: 1.16 (ref 0.00–1.49)
PROTHROMBIN TIME: 15 s (ref 11.6–15.2)

## 2014-09-12 LAB — I-STAT ARTERIAL BLOOD GAS, ED
Acid-base deficit: 16 mmol/L — ABNORMAL HIGH (ref 0.0–2.0)
Bicarbonate: 12.9 mEq/L — ABNORMAL LOW (ref 20.0–24.0)
O2 SAT: 96 %
TCO2: 14 mmol/L (ref 0–100)
pCO2 arterial: 43.1 mmHg (ref 35.0–45.0)
pH, Arterial: 7.085 — CL (ref 7.350–7.450)
pO2, Arterial: 110 mmHg — ABNORMAL HIGH (ref 80.0–100.0)

## 2014-09-12 LAB — APTT: aPTT: 31 seconds (ref 24–37)

## 2014-09-12 SURGERY — LEFT HEART CATHETERIZATION WITH CORONARY ANGIOGRAM
Anesthesia: LOCAL | Laterality: Bilateral

## 2014-09-12 MED ORDER — LIDOCAINE HCL (PF) 1 % IJ SOLN
INTRAMUSCULAR | Status: AC
  Filled 2014-09-12: qty 30

## 2014-09-12 MED ORDER — ASPIRIN 81 MG PO CHEW: 324.0000 mg | CHEWABLE_TABLET | Freq: Once | ORAL | Status: DC

## 2014-09-12 MED ORDER — HEPARIN (PORCINE) IN NACL 2-0.9 UNIT/ML-% IJ SOLN
INTRAMUSCULAR | Status: AC
  Filled 2014-09-12: qty 1000

## 2014-09-12 MED ORDER — BIVALIRUDIN 250 MG IV SOLR
INTRAVENOUS | Status: AC
  Filled 2014-09-12: qty 250

## 2014-09-12 MED ORDER — ETOMIDATE 2 MG/ML IV SOLN
INTRAVENOUS | Status: AC
Start: 2014-09-12 — End: 2014-09-12
  Administered 2014-09-12: 30 mg
  Filled 2014-09-12: qty 20

## 2014-09-12 MED ORDER — HEPARIN SODIUM (PORCINE) 5000 UNIT/ML IJ SOLN
INTRAMUSCULAR | Status: AC
  Administered 2014-09-12: 4000 [IU] via INTRAVENOUS
  Filled 2014-09-12: qty 1

## 2014-09-12 MED ORDER — DOPAMINE-DEXTROSE 3.2-5 MG/ML-% IV SOLN
10.0000 ug/kg/min | Freq: Once | INTRAVENOUS | Status: AC
  Administered 2014-09-12: 10 ug/kg/min via INTRAVENOUS

## 2014-09-12 MED ORDER — EPINEPHRINE HCL 0.1 MG/ML IJ SOSY
PREFILLED_SYRINGE | INTRAMUSCULAR | Status: AC
  Filled 2014-09-12: qty 10

## 2014-09-12 MED ORDER — SODIUM CHLORIDE 0.9 % IV SOLN
Freq: Once | INTRAVENOUS | Status: AC
  Administered 2014-09-12: 20:00:00 via INTRAVENOUS

## 2014-09-12 MED ORDER — LIDOCAINE HCL (CARDIAC) 20 MG/ML IV SOLN
INTRAVENOUS | Status: AC
  Filled 2014-09-12: qty 5

## 2014-09-12 MED ORDER — NOREPINEPHRINE BITARTRATE 1 MG/ML IV SOLN
INTRAVENOUS | Status: AC
  Filled 2014-09-12: qty 4

## 2014-09-12 MED ORDER — SODIUM BICARBONATE 8.4 % IV SOLN
INTRAVENOUS | Status: AC
  Filled 2014-09-12: qty 50

## 2014-09-12 MED ORDER — SUCCINYLCHOLINE CHLORIDE 20 MG/ML IJ SOLN
INTRAMUSCULAR | Status: AC
  Filled 2014-09-12: qty 1

## 2014-09-12 MED ORDER — ATROPINE SULFATE 0.1 MG/ML IJ SOLN
INTRAMUSCULAR | Status: AC
  Filled 2014-09-12: qty 30

## 2014-09-12 MED ORDER — HEPARIN SODIUM (PORCINE) 5000 UNIT/ML IJ SOLN
60.0000 [IU]/kg | Freq: Once | INTRAMUSCULAR | Status: AC
  Administered 2014-09-12: 4000 [IU] via INTRAVENOUS

## 2014-09-12 MED ORDER — MORPHINE SULFATE 2 MG/ML IJ SOLN
INTRAMUSCULAR | Status: AC
  Filled 2014-09-12: qty 2

## 2014-09-12 MED ORDER — TICAGRELOR 90 MG PO TABS
ORAL_TABLET | ORAL | Status: AC
  Filled 2014-09-12: qty 1

## 2014-09-12 MED ORDER — SODIUM CHLORIDE 0.9 % IV SOLN
0.5000 mg/h | INTRAVENOUS | Status: DC
  Filled 2014-09-12: qty 10

## 2014-09-12 MED ORDER — NOREPINEPHRINE BITARTRATE 1 MG/ML IV SOLN
0.0000 ug/min | INTRAVENOUS | Status: DC
  Filled 2014-09-12: qty 4

## 2014-09-12 MED ORDER — ROCURONIUM BROMIDE 50 MG/5ML IV SOLN
INTRAVENOUS | Status: AC
  Administered 2014-09-12: 100 mg
  Filled 2014-09-12: qty 2

## 2014-09-12 MED ORDER — EPINEPHRINE HCL 1 MG/ML IJ SOLN
INTRAMUSCULAR | Status: AC
  Filled 2014-09-12: qty 1

## 2014-09-12 MED ORDER — NITROGLYCERIN 1 MG/10 ML FOR IR/CATH LAB
INTRA_ARTERIAL | Status: AC
  Filled 2014-09-12: qty 10

## 2014-09-12 MED ORDER — ASPIRIN 325 MG PO TABS
ORAL_TABLET | ORAL | Status: AC
  Filled 2014-09-12: qty 1

## 2014-09-12 MED ORDER — MORPHINE SULFATE 4 MG/ML IJ SOLN: 4.0000 mg | Freq: Once | INTRAMUSCULAR | Status: DC

## 2014-09-12 MED ORDER — FENTANYL CITRATE 0.05 MG/ML IJ SOLN
25.0000 ug/h | INTRAMUSCULAR | Status: DC
  Filled 2014-09-12: qty 50

## 2014-09-12 MED ORDER — EPINEPHRINE HCL 1 MG/ML IJ SOLN
0.5000 ug/min | INTRAMUSCULAR | Status: DC
  Filled 2014-09-12: qty 4

## 2014-09-12 MED ORDER — PHENYLEPHRINE HCL 10 MG/ML IJ SOLN
0.0000 ug/min | INTRAMUSCULAR | Status: DC
  Filled 2014-09-12: qty 1

## 2014-09-13 MED FILL — Sodium Chloride IV Soln 0.9%: INTRAVENOUS | Qty: 50 | Status: AC

## 2014-09-16 MED FILL — Medication: Qty: 1 | Status: AC

## 2014-10-01 NOTE — Progress Notes (Signed)
Pt transported to CATH Lab without complication. [facility] to [facility]. RRT.

## 2014-10-01 NOTE — ED Notes (Signed)
Wife at bedside to see pt prior to transport to cath lab.

## 2014-10-01 NOTE — H&P (Signed)
History and Physical   Admit date: 19-Jan-2014 Name:  Shane Molina Medical record number: [id number] DOB/Age:  1941-06-14  74 y.o. male  [facility]  Chief complaint/reason for admission: Chest pain   HPI:  This 74 year old male has a history of a TIA earlier this year has hypertension and hyperlipidemia.  He was in his usual state of health and stated he had not been feeling well for the past 4 days.  His wife noted he had been working outside and he came into the house and stated he did not feel well, went outside and sat on the porch complaining of chest pain and then passed out.  EMS arrived and he was found to be bradycardic with an acute inferior infarction.  He presented originally to the emergency room and was hypotensive and had some complete heart block and pacing was initiated.  He was given a fluid bolus and dopamine was instituted.  When I examined the patient and the pacer was not capturing and the voltage was turned up and his pressure came up somewhat with dopamine as well as a fluid bolus.  He is still hypotensive and is intubated and unresponsive.    Past Medical History  Diagnosis Date  . Stroke   . Hypertension   . Hyperlipidemia      Past Surgical History  Procedure Laterality Date  . Multiple tooth extractions Bilateral 2006    Patient is unsure of year, states "maybe 10 years."   Allergies: is allergic to sulfa antibiotics.   Medications: Prior to Admission medications   Medication Sig Start Date End Date Taking? Authorizing Provider  aspirin 81 MG chewable tablet Chew 81 mg by mouth at bedtime.    Historical Provider, MD  levofloxacin (LEVAQUIN) 500 MG tablet Take 1 tablet (500 mg total) by mouth daily. 12/25/13   Esperanza SheetsUlugbek N Buriev, MD  simvastatin (ZOCOR) 20 MG tablet Take 1 tablet (20 mg total) by mouth daily. 12/25/13   Esperanza SheetsUlugbek N Buriev, MD  valsartan-hydrochlorothiazide (DIOVAN-HCT) 80-12.5 MG per tablet Take 1 tablet by  mouth at bedtime and may repeat dose one time if needed.    Historical Provider, MD    Family History:  Family Status  Relation Status Death Age  . Mother Deceased     Died of infarction  . Father Deceased     Brain tumor  . Brother Deceased     Died of myocardial infarction    Social History:   reports that he quit smoking about 22 years ago. His smoking use included Cigarettes. He smoked 0.00 packs per day. He has never used smokeless tobacco. He reports that he drinks alcohol. He reports that he does not use illicit drugs.   History   Social History Narrative     Review of Systems: Not obtainable  Physical Exam: BP 85/33 mmHg  Pulse 70  Resp 19  SpO2 96% General appearance: Unresponsive male who is currently intubated Head: Normocephalic, without obvious abnormality Neck: Difficult to assess, JVD, appears full Lungs: clear to auscultation bilaterally Heart: Distant heart sounds, rhythm is slow. Abdomen: soft, non-tender; bowel sounds normal; no masses,  no organomegaly Rectal: deferred Extremities: Trace edema noted Pulses: Difficult to feel Neurologic: Unable to assess  Labs: CBC  Recent Labs  27-Feb-2014 1950 27-Feb-2014 1957  WBC 14.4*  --   RBC 3.75*  --   HGB 10.0* 10.9*  HCT 31.9* 32.0*  PLT 334  --   MCV 85.1  --  MCH 26.7  --   MCHC 31.3  --   RDW 14.9  --   LYMPHSABS 5.5*  --   MONOABS 1.3*  --   EOSABS 0.5  --   BASOSABS 0.0  --    CMP   Recent Labs  2014/02/12 1957  NA 140  K 3.3*  CL 108  GLUCOSE 235*  BUN 21  CREATININE 1.30   EKG: Sinus bradycardia with periods of complete heart block, acute inferior infarction  Radiology: Not done yet   IMPRESSIONS: 1.  Cardiogenic shock. 2.  Acute inferior myocardial infarction. 3.  Complete heart block. 4.  Hypertension. 5.  Hyperlipidemia. 6.  History of TIA 7.  Hypokalemia. 8.  Anemia  PLAN: Patient has cardiogenic shock and looks like an acute inferior infarction.  He was  pacing in the emergency room and will be given a bolus of heparin.  He is currently critically ill and has been intubated.  He will be taken to the cardiac catheterization laboratory for acute cardiac catheterization and intervention.  Signed: Darden PalmerW. Spencer Tilley, Jr. MD [facility]  Feb 04, 2014, 8:29 PM

## 2014-10-01 NOTE — Progress Notes (Signed)
   Jan 26, 2014 2221  Clinical Encounter Type  Visited With Family;Health care provider  Visit Type Spiritual support;Death  Chaplain responded to page from cath lab requesting chaplain support as dr informed family of pt's death. Chaplain provided emotional support, particularly to pt's spouse. Chaplain escorted pt's spouse and sons to view pt's body. Family supporting each other well, and prayed together. Family expressed gratitude for chaplain support. When chaplain left, family in waiting area and pt's body being moved to another room so family can spend more time with him if they'd like. Page if needed.  Wille GlaserMcCray, Garris Melhorn O, Chaplain Feb 01, 2014 10:23 PM

## 2014-10-01 NOTE — ED Notes (Addendum)
Labs obtained.  Pt incontinent of large amount of stool.  Cleaned of stool.

## 2014-10-01 NOTE — ED Notes (Signed)
Dr. Donnie Ahoilley at bedside.  Pacemaker not capturing at this time.

## 2014-10-01 NOTE — Progress Notes (Signed)
   October 10, 2014 2130  Clinical Encounter Type  Visited With Family  Visit Type Spiritual support;Other (Comment) (Stemi)  Chaplain received report from day chaplain that code stemi in progress. Chaplain met pt's spouse in ED and walked with her to cath lab waiting area. Chaplain provided emotional support. Chaplain informed cath team that spouse was present. Pt's two sons then arrived, and helped provide support for pt's spouse. Page if needed.   Vanetta Mulders Oct 10, 2014 9:32 PM

## 2014-10-01 NOTE — ED Provider Notes (Signed)
CSN: [id number]     Arrival date & time May 18, 2014  1938 History   First MD Initiated Contact with Patient May 18, 2014 2022     Chief Complaint  Patient presents with  . Code STEMI     (Consider location/radiation/quality/duration/timing/severity/associated sxs/prior Treatment) HPI  74 year old male patient presenting with altered mental status. The patient's wife reports that the patient has been feeling ill for several days with some nausea.  She says that he was working in the yard this afternoon when at approximately 6 PM he began to feel week and have some chest pain. He came inside , and lay down on the couch for a while. He then went to go to the bathroom, and felt weak, and collapsed.  EMS arrived to find him  With altered mental status, hypotension and bradycardia. He apparently had some improvement in his blood pressure with fluids, and EKG showed inferior ST elevation. He was transported emergently here.    Past Medical History  Diagnosis Date  . Stroke   . Hypertension   . Hyperlipidemia    Past Surgical History  Procedure Laterality Date  . Multiple tooth extractions Bilateral 2006    Patient is unsure of year, states "maybe 10 years."   No family history on file. History  Substance Use Topics  . Smoking status: Former Smoker    Types: Cigarettes    Quit date: 12/25/1991  . Smokeless tobacco: Never Used  . Alcohol Use: Yes     Comment: occasionally    Review of Systems  Unable to perform ROS: Acuity of condition      Allergies  Sulfa antibiotics  Home Medications   Prior to Admission medications   Medication Sig Start Date End Date Taking? Authorizing Provider  aspirin 81 MG chewable tablet Chew 81 mg by mouth at bedtime.    Historical Provider, MD  levofloxacin (LEVAQUIN) 500 MG tablet Take 1 tablet (500 mg total) by mouth daily. 12/25/13   Esperanza SheetsUlugbek N Buriev, MD  simvastatin (ZOCOR) 20 MG tablet Take 1 tablet (20 mg total) by mouth daily. 12/25/13   Esperanza SheetsUlugbek N  Buriev, MD  valsartan-hydrochlorothiazide (DIOVAN-HCT) 80-12.5 MG per tablet Take 1 tablet by mouth at bedtime and may repeat dose one time if needed.    Historical Provider, MD   BP 72/40 mmHg  Pulse 114  Resp 16  SpO2 96% Physical Exam  Constitutional: He appears distressed.  HENT:  Head: Normocephalic and atraumatic.  Eyes: Pupils are equal, round, and reactive to light.  Neck: Normal range of motion. No JVD present. No tracheal deviation present.  Cardiovascular: Regular rhythm.  Exam reveals decreased pulses.   Pulmonary/Chest: Effort normal and breath sounds normal. No respiratory distress.  Abdominal: Soft. He exhibits no distension. There is no tenderness.  Musculoskeletal: He exhibits no edema or tenderness.  Neurological:   Responsive to verbal stimuli only  Skin: He is diaphoretic. There is pallor.  Nursing note and vitals reviewed.   ED Course  INTUBATION Date/Time: 02/16/14 8:37 PM Performed by: Erskine EmeryAVIS, Alanzo Lamb Authorized by: Erskine EmeryAVIS, Ameriah Lint Consent: The procedure was performed in an emergent situation. Indications: airway protection Intubation method: video-assisted Patient status: paralyzed (RSI) Paralytic: rocuronium Tube size: 7.5 mm Tube type: cuffed Number of attempts: 1 Cords visualized: yes Post-procedure assessment: chest rise,  ETCO2 monitor and CO2 detector Breath sounds: equal and absent over the epigastrium Cuff inflated: yes ETT to lip: 24 cm Tube secured with: ETT holder Chest x-ray interpreted by me. Chest x-ray findings: endotracheal tube  in appropriate position Patient tolerance: Patient tolerated the procedure well with no immediate complications  CRITICAL CARE Performed by: Erskine Emery Authorized by: Erskine Emery Total critical care time: 45 minutes Critical care was necessary to treat or prevent imminent or life-threatening deterioration of the following conditions: cardiac failure, circulatory failure and shock. Critical care was time  spent personally by me on the following activities: development of treatment plan with patient or surrogate, discussions with consultants, examination of patient, evaluation of patient's response to treatment, obtaining history from patient or surrogate, ordering and performing treatments and interventions, ordering and review of radiographic studies, ordering and review of laboratory studies, pulse oximetry, re-evaluation of patient's condition, review of old charts, transcutaneous pacing and ventilator management.   (including critical care time) Labs Review Labs Reviewed  CBC - Abnormal; Notable for the following:    WBC 14.4 (*)    RBC 3.75 (*)    Hemoglobin 10.0 (*)    HCT 31.9 (*)    All other components within normal limits  DIFFERENTIAL - Abnormal; Notable for the following:    Lymphs Abs 5.5 (*)    Monocytes Absolute 1.3 (*)    All other components within normal limits  BASIC METABOLIC PANEL - Abnormal; Notable for the following:    CO2 14 (*)    Glucose, Bld 236 (*)    Calcium 8.1 (*)    GFR calc non Af Amer 56 (*)    GFR calc Af Amer 65 (*)    Anion gap 21 (*)    All other components within normal limits  I-STAT CHEM 8, ED - Abnormal; Notable for the following:    Potassium 3.3 (*)    Glucose, Bld 235 (*)    Calcium, Ion 1.06 (*)    Hemoglobin 10.9 (*)    HCT 32.0 (*)    All other components within normal limits  I-STAT TROPOININ, ED - Abnormal; Notable for the following:    Troponin i, poc 0.70 (*)    All other components within normal limits  I-STAT ARTERIAL BLOOD GAS, ED - Abnormal; Notable for the following:    pH, Arterial 7.085 (*)    pO2, Arterial 110.0 (*)    Bicarbonate 12.9 (*)    Acid-base deficit 16.0 (*)    All other components within normal limits  PROTIME-INR  APTT  BLOOD GAS, ARTERIAL  URINALYSIS, ROUTINE W REFLEX MICROSCOPIC  I-STAT CG4 LACTIC ACID, ED    Imaging Review Dg Chest Portable 1 View  2014-09-14   CLINICAL DATA:  Could STEMI.  Chest pain. Emergent intubation. Initial encounter.  EXAM: PORTABLE CHEST - 1 VIEW  COMPARISON:  07/04/2012.  FINDINGS: 2000 hr. Endotracheal tube tip is in the midtrachea. The retention balloon for the endotracheal tube may be slightly overinflated. Nasogastric tube projects below the diaphragm, tip not visualized. Patient is mildly rotated to the right. The superior mediastinum appears mildly widened, possibly in part due to the rotation. There are bilateral perihilar opacities suspicious for edema. There is an asymmetric component in the left lower lobe. No consolidation or significant pleural effusion is seen. The bones appear intact. Telemetry leads overlie the chest.  IMPRESSION: 1. Satisfactorily positioned endotracheal tube. The retention balloon may be slightly overinflated. 2. Bilateral perihilar opacity suspicious for edema or aspiration. 3. Widened superior mediastinum, possibly related to rotation and lower lung volumes. Follow-up necessary to exclude a right paratracheal mass. This could be evaluated with CT if clinically warranted.   Electronically Signed   By:  Roxy HorsemanBill  Veazey M.D.   On: 12/23/13 20:32     EKG Interpretation None       MDM   Final diagnoses:  Required emergent intubation  ST elevation myocardial infarction (STEMI), unspecified artery  Cardiogenic shock  Complete heart block     74 year old male presents in distress, with ST elevation MI, complete heart block, cardiogenic shock,  And altered mental status. Upon his arrival to the department, the patient was alert, and will respond to verbal stimuli come but was disoriented.   Prehospital EKG, as well as EKG  Performed on his arrival shows acute inferior ST elevation MI. He initially was bradycardic, but normotensive , but while obtaining vascular access, the patient's blood pressure dropped to 60 systolic, with persistent rate in the 30s , with intermittent complete heart block.  He received a total of 3 L of IV fluids,  with minimal improvement in his blood pressure. Transcutaneous pacing was started, as well as dopamine.    The patient became increasingly agitated, and would not tolerate pacing , and considering his clinical trajectory , need for cath lab , he was intubated and sedated.  The patient was bolused with heparin, dopamine infusion was continued, and cath lab was activated.      Erskine Emeryhris Kayon Dozier, MD 2014-03-08 2359  Nelia Shiobert L Beaton, MD 09/18/14 [phone]

## 2014-10-01 NOTE — CV Procedure (Signed)
Shane Molina is a 74 y.o. male    [id number] LOCATION:  FACILITY: [facility]  PHYSICIAN: Quay Burow, M.D. May 08, 1941   DATE OF PROCEDURE:  September 18, 2014  DATE OF DISCHARGE:     CARDIAC CATHETERIZATION     History obtained from chart review.Shane Molina is an unfortunate 74 year old married Caucasian male without prior cardiac history who presented to [facility] emergency room in complete heart block and hypotensive after feeling nauseated earlier this evening. He was intubated, placed on dopamine and transcutaneous pads were placed for external pacing. His EKG apparently showed an inferior wall myocardial infarction. He is brought emergently to the cardiac Cath Lab for angiography and intervention.   PROCEDURE DESCRIPTION:   The patient was brought to the second floor [facility] cath lab in the postabsorptive state. He was not premedicated . His right groinwas prepped and shaved in usual sterile fashion. Xylocaine 1% was used for local anesthesia. A 6 French sheath was inserted into the right common femoral  artery using standard Seldinger technique. A 6 French sheath was inserted into the right common femoral vein. A 5 French temperatures this pacemaker was placed under fluoroscopic guidance in the RV apex and pacing was initiated at a rate of 80 at 5 MA.. 5 French right and left Judkins diagnostic catheters were used for selective coronary angiography. Visipaque dye was used for the entirety of the case (120 mL). Retrograde aortic pressure was monitored during the case.   HEMODYNAMICS:    AO SYSTOLIC/AO DIASTOLIC: 87/86   ANGIOGRAPHIC RESULTS:   1. Left main; 50%, diffuse disease  2. LAD; 60-70% segmental mid before the takeoff of a moderate-sized branch 3. Left circumflex; this was the culprit vessel. It was nondominant and was occluded in the mid AV groove.  4. Right coronary artery; 95% ostial, 80% mid 5. Left ventriculography;not performed   IMPRESSION:Shane Molina has an  occluded nondominant circumflex, high-grade ostial RCA disease with cardiac shock and complete heart block requiring temporary transvenous pacing. We will proceed with intervention of the left circumflex coronary artery plus or minus the right coronary artery  Procedure description: He was intubated and ventilated. He remained in cardiac shock throughout the case. His femoral vessels were extremely calcified. He received Angiomax bolus.. Using a 6 [place] XB 3.5; guide along with a 1/190 cm long P[place]ater wire and a 2 mm balloon the lesion was crossed and predilatation was performed. Following this a 2 mm x 15 mm long vision bare metal stent was then deployed at 16 atm. It was fully expanded. After this, the patient's blood pressure continued to spiral down. CPR was initiated. He received multiple rounds of intravenous epinephrine and atropine. Neo-Synephrine was begun. An amp of bicarbonate was administered. Ultimately he lost electrical activity and cardiac motion and became flat line. The code was called at 2130.  Final impression: Acute inferior wall myocardial infarction secondary to occluded nondominant circumflex with complete heart block and cardiogenic shock. He has successful stent implantation but ultimately CPR was begun and the patient expired.      Lorretta Harp MD, [facility] 18-Sep-2014 9:41 PM

## 2014-10-01 NOTE — ED Notes (Addendum)
Pt arrived via [facility] as Code Stemi.  Dr. Radford PaxBeaton and resident at bedside on arrival.  Skin mottled.  Pt moaning in pain.  Unable to lay still. EMS reports pt has been pulling leads off and started c/o chest pain across chest enroute to ED.

## 2014-10-01 NOTE — ED Notes (Signed)
Pt to Cath lab

## 2014-10-01 NOTE — ED Notes (Signed)
Dr. Earlene Plateravis (ED resident) and Dr. Radford PaxBeaton preparing to intubate pt.

## 2014-10-01 NOTE — ED Notes (Signed)
[facility] reports pt from home.  Reports 3 day history of feeling sick and vomiting.  Pt Code Stemi- activated on arrival to ED.  Zofran IV given enroute by [facility].

## 2014-10-01 DEATH — deceased

## 2015-07-21 IMAGING — CR DG CHEST 1V PORT
1 series · 1 of 1 positions shown · non-contrast
Comparison: 07/04/2012.

CLINICAL DATA: Could STEMI. Chest pain. Emergent intubation.
Initial encounter.

EXAM:
PORTABLE CHEST - 1 VIEW

[AP]
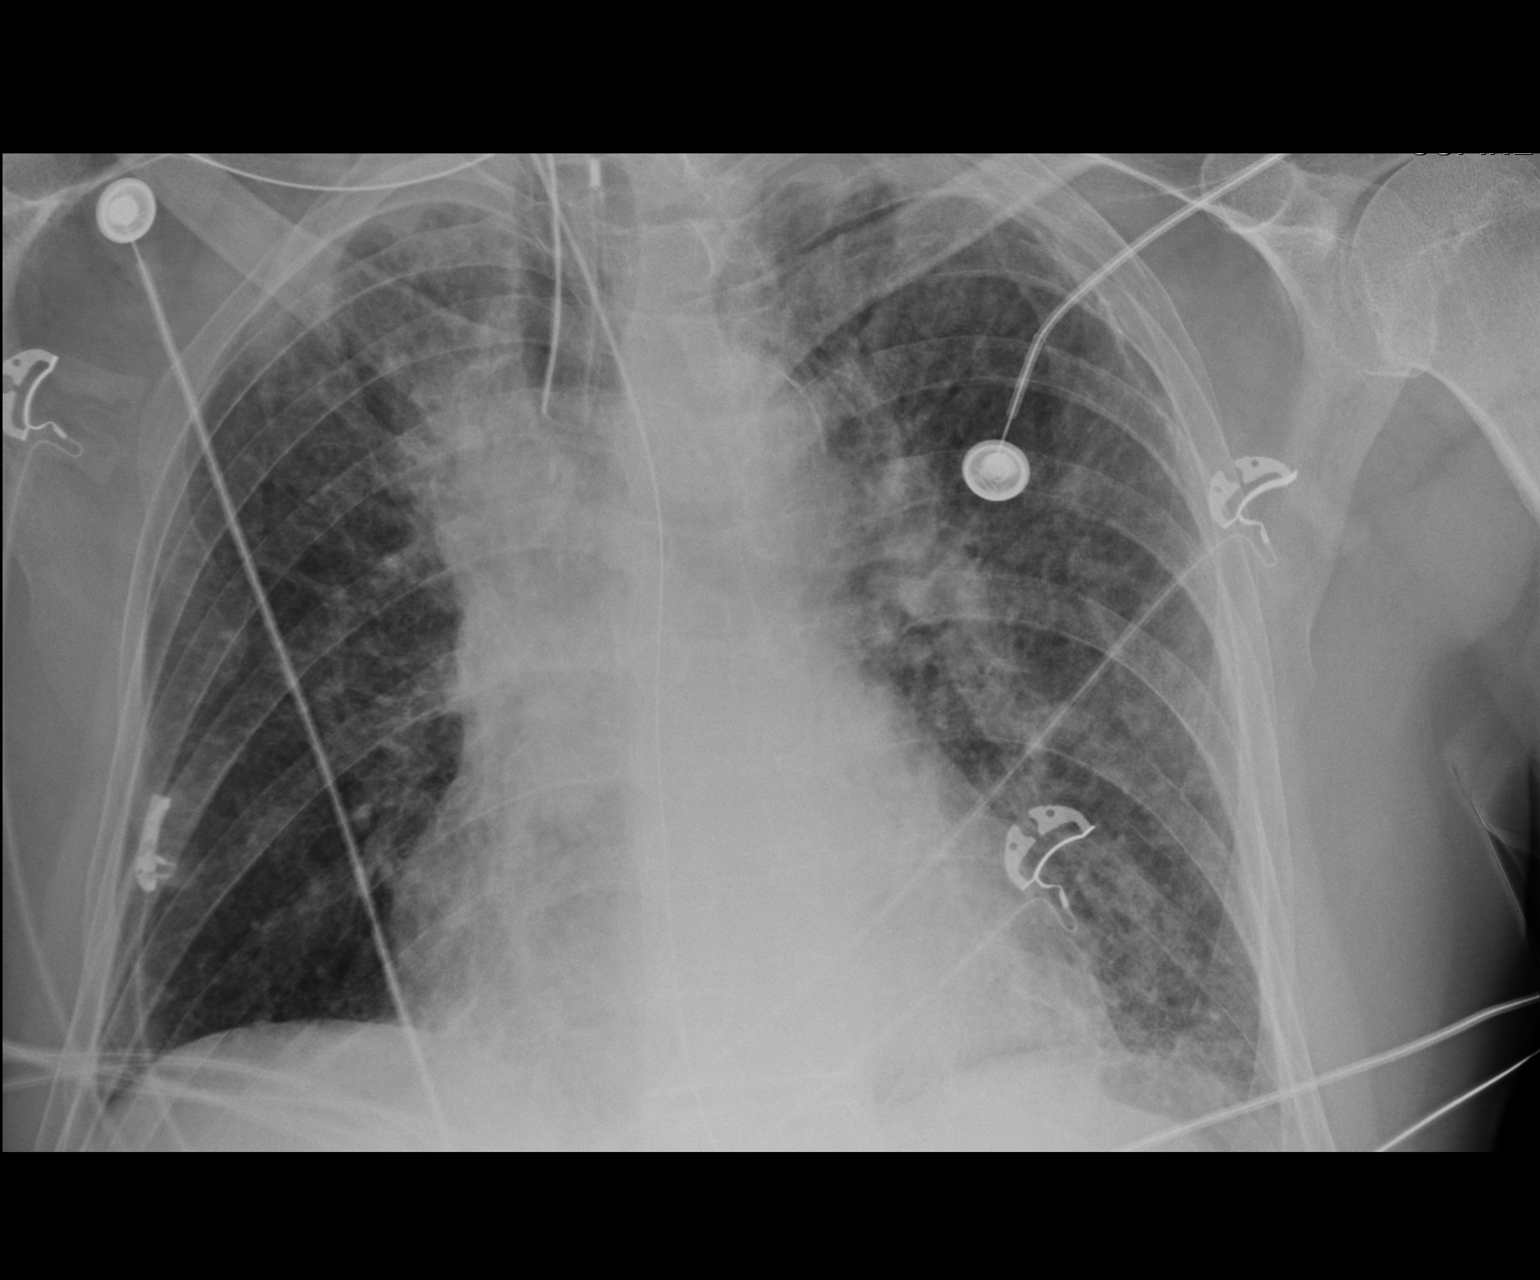

[1 of 1 positions shown; findings below may reference images not displayed]

FINDINGS: 7888 hr. Endotracheal tube tip is in the midtrachea. The retention
balloon for the endotracheal tube may be slightly overinflated.
Nasogastric tube projects below the diaphragm, tip not visualized.
Patient is mildly rotated to the right. The superior mediastinum
appears mildly widened, possibly in part due to the rotation. There
are bilateral perihilar opacities suspicious for edema. There is an
asymmetric component in the left lower lobe. No consolidation or
significant pleural effusion is seen. The bones appear intact.
Telemetry leads overlie the chest.
IMPRESSION: 1. Satisfactorily positioned endotracheal tube. The retention
balloon may be slightly overinflated.
2. Bilateral perihilar opacity suspicious for edema or aspiration.
3. Widened superior mediastinum, possibly related to rotation and
lower lung volumes. Follow-up necessary to exclude a right
paratracheal mass. This could be evaluated with CT if clinically
warranted.
